# Patient Record
Sex: Female | Born: 1990 | Race: Black or African American | Hispanic: No | Marital: Single | Smoking: Former smoker
Health system: Southern US, Community
[De-identification: ages and names within clinical notes are randomized; demographics above are authoritative.]

## PROBLEM LIST (undated history)

## (undated) ENCOUNTER — Inpatient Hospital Stay (HOSPITAL_COMMUNITY): Payer: Self-pay

## (undated) DIAGNOSIS — F419 Anxiety disorder, unspecified: Secondary | ICD-10-CM

## (undated) DIAGNOSIS — G43909 Migraine, unspecified, not intractable, without status migrainosus: Secondary | ICD-10-CM

## (undated) DIAGNOSIS — A749 Chlamydial infection, unspecified: Secondary | ICD-10-CM

## (undated) DIAGNOSIS — O98819 Other maternal infectious and parasitic diseases complicating pregnancy, unspecified trimester: Secondary | ICD-10-CM

## (undated) HISTORY — PX: DILATION AND CURETTAGE OF UTERUS: SHX78

---
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
## 2010-05-05 LAB — POCT RAPID STREP A (OFFICE): Streptococcus, Group A Screen (Direct): POSITIVE — AB

## 2010-05-16 LAB — POCT URINALYSIS DIP (DEVICE)
Bilirubin Urine: NEGATIVE
Glucose, UA: NEGATIVE mg/dL
Hgb urine dipstick: NEGATIVE
Ketones, ur: NEGATIVE mg/dL
Nitrite: NEGATIVE
Protein, ur: NEGATIVE mg/dL
Specific Gravity, Urine: 1.015 (ref 1.005–1.030)
Urobilinogen, UA: 0.2 mg/dL (ref 0.0–1.0)
pH: 5 (ref 5.0–8.0)

## 2010-05-16 LAB — POCT PREGNANCY, URINE: Preg Test, Ur: NEGATIVE

## 2010-05-25 LAB — CBC
HCT: 27 % — ABNORMAL LOW (ref 36.0–49.0)
HCT: 33.8 % — ABNORMAL LOW (ref 36.0–49.0)
Hemoglobin: 11 g/dL — ABNORMAL LOW (ref 12.0–16.0)
Hemoglobin: 8.8 g/dL — ABNORMAL LOW (ref 12.0–16.0)
MCHC: 32.6 g/dL (ref 31.0–37.0)
MCHC: 32.7 g/dL (ref 31.0–37.0)
MCV: 89.7 fL (ref 78.0–98.0)
MCV: 91.2 fL (ref 78.0–98.0)
Platelets: 159 10*3/uL (ref 150–400)
Platelets: 185 10*3/uL (ref 150–400)
RBC: 2.96 MIL/uL — ABNORMAL LOW (ref 3.80–5.70)
RBC: 3.76 MIL/uL — ABNORMAL LOW (ref 3.80–5.70)
RDW: 21.6 % — ABNORMAL HIGH (ref 11.4–15.5)
RDW: 21.8 % — ABNORMAL HIGH (ref 11.4–15.5)
WBC: 11.4 10*3/uL (ref 4.5–13.5)
WBC: 6.1 10*3/uL (ref 4.5–13.5)

## 2010-05-25 LAB — DIFFERENTIAL
Basophils Absolute: 0 10*3/uL (ref 0.0–0.1)
Basophils Relative: 0 % (ref 0–1)
Eosinophils Absolute: 0 10*3/uL (ref 0.0–1.2)
Eosinophils Relative: 0 % (ref 0–5)
Lymphocytes Relative: 13 % — ABNORMAL LOW (ref 24–48)
Lymphs Abs: 1.5 10*3/uL (ref 1.1–4.8)
Monocytes Absolute: 1 10*3/uL (ref 0.2–1.2)
Monocytes Relative: 9 % (ref 3–11)
Neutro Abs: 8.9 10*3/uL — ABNORMAL HIGH (ref 1.7–8.0)
Neutrophils Relative %: 78 % — ABNORMAL HIGH (ref 43–71)

## 2010-05-25 LAB — RPR: RPR Ser Ql: NONREACTIVE

## 2010-05-26 ENCOUNTER — Inpatient Hospital Stay (INDEPENDENT_AMBULATORY_CARE_PROVIDER_SITE_OTHER)
Admission: RE | Admit: 2010-05-26 | Discharge: 2010-05-26 | Disposition: A | Discharge status: Home / Self Care | Payer: Self-pay | Source: Ambulatory Visit | Attending: Family Medicine | Admitting: Family Medicine

## 2010-05-26 DIAGNOSIS — R609 Edema, unspecified: Secondary | ICD-10-CM

## 2010-06-02 LAB — URINALYSIS, ROUTINE W REFLEX MICROSCOPIC
Bilirubin Urine: NEGATIVE
Glucose, UA: NEGATIVE mg/dL
Ketones, ur: 15 mg/dL — AB
Nitrite: NEGATIVE
Protein, ur: NEGATIVE mg/dL
Specific Gravity, Urine: 1.015 (ref 1.005–1.030)
Urobilinogen, UA: 2 mg/dL — ABNORMAL HIGH (ref 0.0–1.0)
pH: 6 (ref 5.0–8.0)

## 2010-06-02 LAB — POCT PREGNANCY, URINE: Preg Test, Ur: POSITIVE

## 2010-06-02 LAB — CBC
HCT: 34.8 % — ABNORMAL LOW (ref 36.0–49.0)
Hemoglobin: 11.5 g/dL — ABNORMAL LOW (ref 12.0–16.0)
MCHC: 33.1 g/dL (ref 31.0–37.0)
MCV: 94.9 fL (ref 78.0–98.0)
Platelets: 199 10*3/uL (ref 150–400)
RBC: 3.66 MIL/uL — ABNORMAL LOW (ref 3.80–5.70)
RDW: 18.3 % — ABNORMAL HIGH (ref 11.4–15.5)
WBC: 6.7 10*3/uL (ref 4.5–13.5)

## 2010-06-02 LAB — URINE MICROSCOPIC-ADD ON

## 2010-06-02 LAB — HCG, QUANTITATIVE, PREGNANCY: hCG, Beta Chain, Quant, S: 34819 m[IU]/mL — ABNORMAL HIGH (ref <5)

## 2010-06-02 LAB — ABO/RH: ABO/RH(D): A POS

## 2010-08-14 ENCOUNTER — Emergency Department (HOSPITAL_COMMUNITY)
Admission: EM | Admit: 2010-08-14 | Discharge: 2010-08-14 | Disposition: A | Discharge status: Home / Self Care | Payer: Medicaid Other | Attending: Emergency Medicine | Admitting: Emergency Medicine

## 2010-08-14 ENCOUNTER — Emergency Department (HOSPITAL_COMMUNITY): Payer: Medicaid Other

## 2010-08-14 DIAGNOSIS — N390 Urinary tract infection, site not specified: Secondary | ICD-10-CM | POA: Insufficient documentation

## 2010-08-14 DIAGNOSIS — R1031 Right lower quadrant pain: Secondary | ICD-10-CM | POA: Insufficient documentation

## 2010-08-14 DIAGNOSIS — R109 Unspecified abdominal pain: Secondary | ICD-10-CM | POA: Insufficient documentation

## 2010-08-14 LAB — CBC
HCT: 35.6 % — ABNORMAL LOW (ref 36.0–46.0)
Hemoglobin: 12.4 g/dL (ref 12.0–15.0)
MCH: 31.5 pg (ref 26.0–34.0)
MCHC: 34.8 g/dL (ref 30.0–36.0)
MCV: 90.4 fL (ref 78.0–100.0)
Platelets: 248 10*3/uL (ref 150–400)
RBC: 3.94 MIL/uL (ref 3.87–5.11)
RDW: 14.1 % (ref 11.5–15.5)
WBC: 5.5 10*3/uL (ref 4.0–10.5)

## 2010-08-14 LAB — COMPREHENSIVE METABOLIC PANEL
ALT: 12 U/L (ref 0–35)
AST: 17 U/L (ref 0–37)
Albumin: 3.7 g/dL (ref 3.5–5.2)
Alkaline Phosphatase: 56 U/L (ref 39–117)
BUN: 11 mg/dL (ref 6–23)
CO2: 23 mEq/L (ref 19–32)
Calcium: 9.4 mg/dL (ref 8.4–10.5)
Chloride: 105 mEq/L (ref 96–112)
Creatinine, Ser: 0.75 mg/dL (ref 0.50–1.10)
GFR calc Af Amer: >60 mL/min (ref >60)
GFR calc non Af Amer: >60 mL/min (ref >60)
Glucose, Bld: 87 mg/dL (ref 70–99)
Potassium: 3.7 mEq/L (ref 3.5–5.1)
Sodium: 137 mEq/L (ref 135–145)
Total Bilirubin: 0.2 mg/dL — ABNORMAL LOW (ref 0.3–1.2)
Total Protein: 7.3 g/dL (ref 6.0–8.3)

## 2010-08-14 LAB — URINALYSIS, ROUTINE W REFLEX MICROSCOPIC
Bilirubin Urine: NEGATIVE
Glucose, UA: NEGATIVE mg/dL
Ketones, ur: NEGATIVE mg/dL
Leukocytes, UA: NEGATIVE
Nitrite: POSITIVE — AB
Protein, ur: NEGATIVE mg/dL
Specific Gravity, Urine: 1.022 (ref 1.005–1.030)
Urobilinogen, UA: 1 mg/dL (ref 0.0–1.0)
pH: 7 (ref 5.0–8.0)

## 2010-08-14 LAB — DIFFERENTIAL
Basophils Absolute: 0 10*3/uL (ref 0.0–0.1)
Basophils Relative: 0 % (ref 0–1)
Eosinophils Absolute: 0.1 10*3/uL (ref 0.0–0.7)
Eosinophils Relative: 2 % (ref 0–5)
Lymphocytes Relative: 53 % — ABNORMAL HIGH (ref 12–46)
Lymphs Abs: 2.9 10*3/uL (ref 0.7–4.0)
Monocytes Absolute: 0.6 10*3/uL (ref 0.1–1.0)
Monocytes Relative: 10 % (ref 3–12)
Neutro Abs: 1.9 10*3/uL (ref 1.7–7.7)
Neutrophils Relative %: 35 % — ABNORMAL LOW (ref 43–77)

## 2010-08-14 LAB — PREGNANCY, URINE: Preg Test, Ur: NEGATIVE

## 2010-08-14 LAB — URINE MICROSCOPIC-ADD ON

## 2010-08-14 LAB — POCT PREGNANCY, URINE: Preg Test, Ur: NEGATIVE

## 2010-11-14 LAB — GC/CHLAMYDIA PROBE AMP, GENITAL
Chlamydia, DNA Probe: NEGATIVE
GC Probe Amp, Genital: NEGATIVE

## 2010-11-14 LAB — POCT PREGNANCY, URINE
Operator id: 12584
Preg Test, Ur: POSITIVE

## 2010-11-14 LAB — URINALYSIS, ROUTINE W REFLEX MICROSCOPIC
Bilirubin Urine: NEGATIVE
Glucose, UA: NEGATIVE
Hgb urine dipstick: NEGATIVE
Ketones, ur: NEGATIVE
Nitrite: NEGATIVE
Protein, ur: NEGATIVE
Specific Gravity, Urine: 1.015
Urobilinogen, UA: 0.2
pH: 7

## 2010-11-14 LAB — WET PREP, GENITAL
Clue Cells Wet Prep HPF POC: NONE SEEN
Yeast Wet Prep HPF POC: NONE SEEN

## 2011-09-23 IMAGING — CR DG ELBOW COMPLETE 3+V*L*
4 series · 4 of 4 positions shown · non-contrast
Comparison: None

CLINICAL DATA: Spider bite.  Pain, swelling, redness from below
elbow to just above the elbow on posterior side of left arm for 2
days.  Rule out involvement of the joint versus soft tissue.

LEFT ELBOW - COMPLETE 3+ VIEW

[w elbow joint a.p. left]
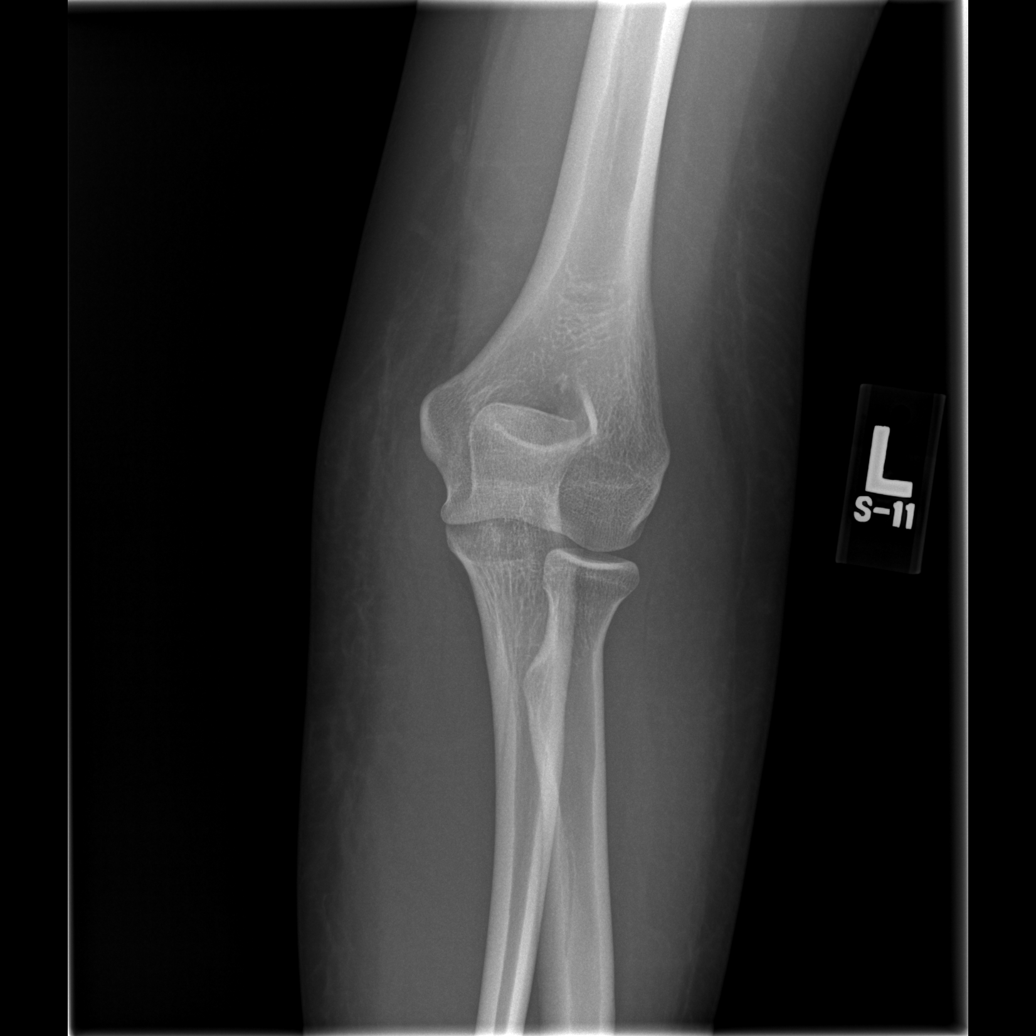

[w elbow joint obl left (1 of 2)]
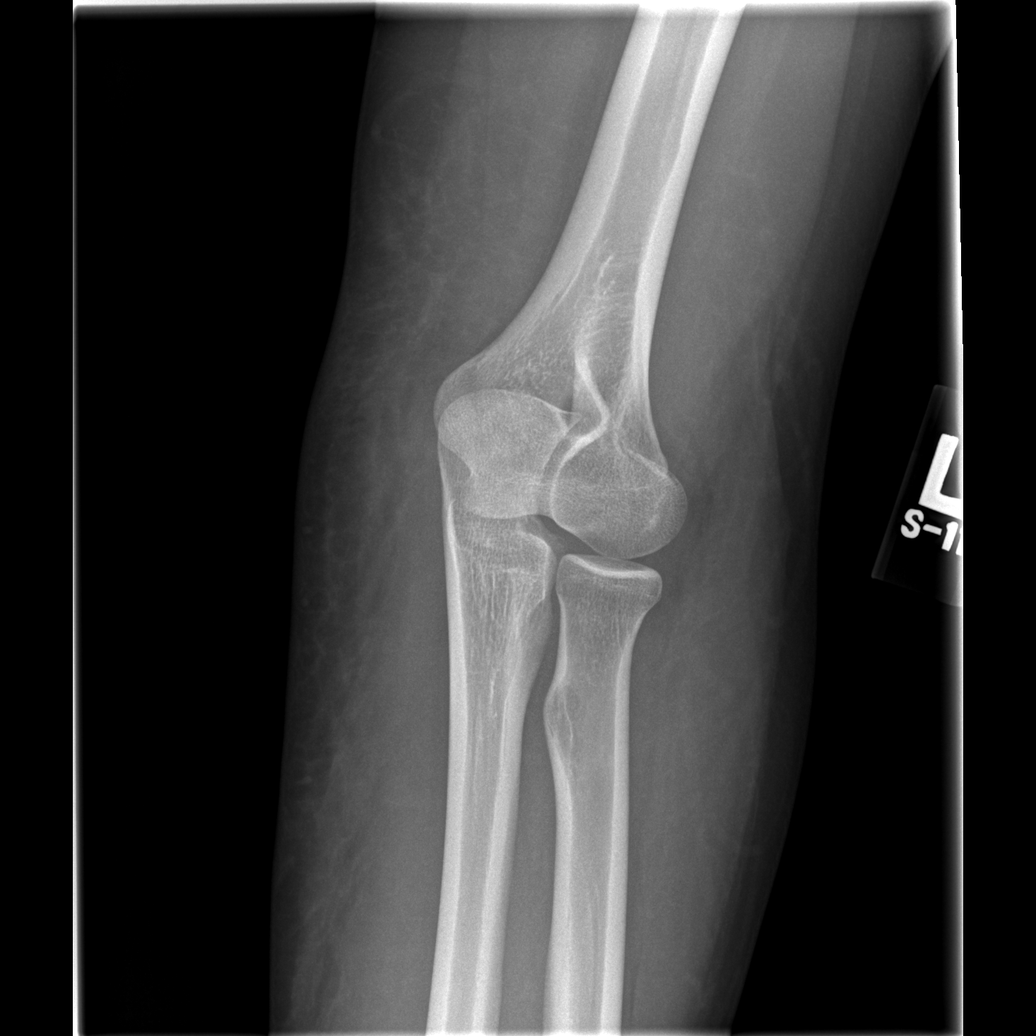

[w elbow joint obl left (2 of 2)]
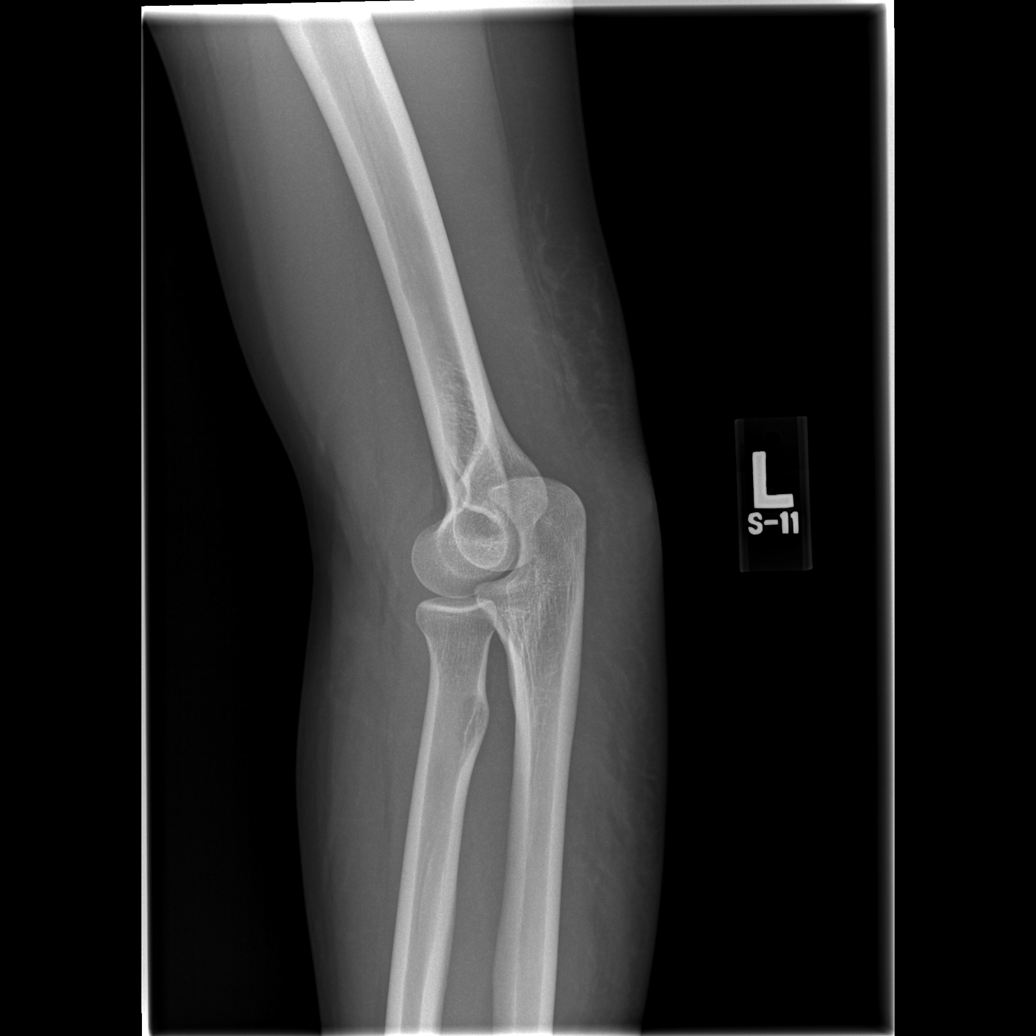

[w elbow joint lat left]
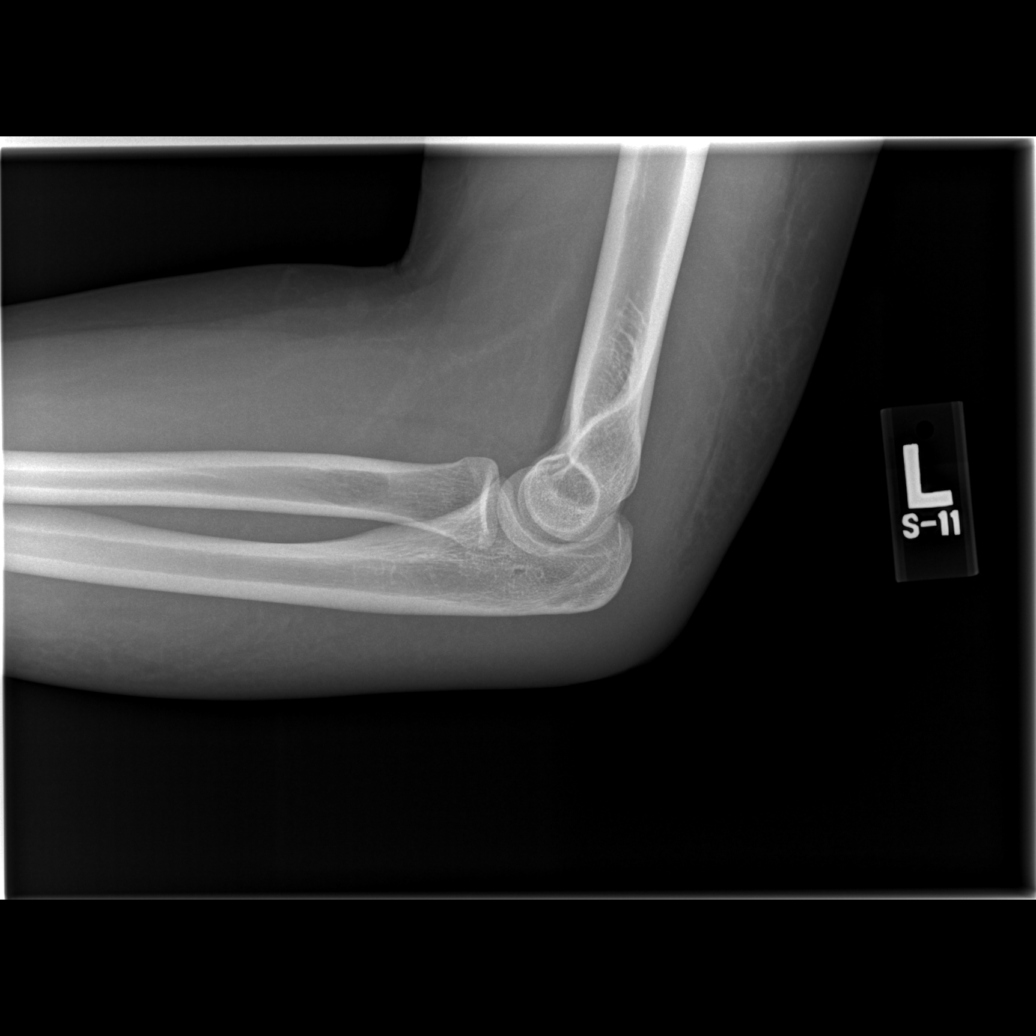

[4 of 4 positions shown; findings below may reference images not displayed]

FINDINGS: There is soft tissue swelling along the posterior aspect
of the elbow.  No evidence for acute fracture dislocation.  No
evidence for bony erosion or joint effusion.  No radiopaque foreign
body identified.
IMPRESSION: Soft tissue swelling. No evidence for acute bony abnormality.

## 2011-10-26 ENCOUNTER — Encounter (HOSPITAL_COMMUNITY): Payer: Self-pay | Admitting: *Deleted

## 2011-10-26 ENCOUNTER — Inpatient Hospital Stay (HOSPITAL_COMMUNITY)
Admission: AD | Admit: 2011-10-26 | Discharge: 2011-10-26 | Disposition: A | Discharge status: Home / Self Care | Payer: Medicaid Other | Source: Ambulatory Visit | Attending: Obstetrics & Gynecology | Admitting: Obstetrics & Gynecology

## 2011-10-26 ENCOUNTER — Inpatient Hospital Stay (HOSPITAL_COMMUNITY): Payer: Medicaid Other

## 2011-10-26 DIAGNOSIS — A499 Bacterial infection, unspecified: Secondary | ICD-10-CM

## 2011-10-26 DIAGNOSIS — R109 Unspecified abdominal pain: Secondary | ICD-10-CM | POA: Insufficient documentation

## 2011-10-26 DIAGNOSIS — O2390 Unspecified genitourinary tract infection in pregnancy, unspecified trimester: Secondary | ICD-10-CM | POA: Insufficient documentation

## 2011-10-26 DIAGNOSIS — N760 Acute vaginitis: Secondary | ICD-10-CM | POA: Insufficient documentation

## 2011-10-26 DIAGNOSIS — B9689 Other specified bacterial agents as the cause of diseases classified elsewhere: Secondary | ICD-10-CM | POA: Insufficient documentation

## 2011-10-26 DIAGNOSIS — Z3490 Encounter for supervision of normal pregnancy, unspecified, unspecified trimester: Secondary | ICD-10-CM

## 2011-10-26 HISTORY — DX: Migraine, unspecified, not intractable, without status migrainosus: G43.909

## 2011-10-26 LAB — URINE MICROSCOPIC-ADD ON

## 2011-10-26 LAB — POCT PREGNANCY, URINE: Preg Test, Ur: POSITIVE — AB

## 2011-10-26 LAB — WET PREP, GENITAL: Yeast Wet Prep HPF POC: NONE SEEN

## 2011-10-26 LAB — URINALYSIS, ROUTINE W REFLEX MICROSCOPIC
Bilirubin Urine: NEGATIVE
Glucose, UA: NEGATIVE mg/dL
Hgb urine dipstick: NEGATIVE
Ketones, ur: NEGATIVE mg/dL
Nitrite: NEGATIVE
Protein, ur: NEGATIVE mg/dL
Specific Gravity, Urine: 1.025 (ref 1.005–1.030)
Urobilinogen, UA: 0.2 mg/dL (ref 0.0–1.0)
pH: 7 (ref 5.0–8.0)

## 2011-10-26 LAB — CBC
HCT: 35.3 % — ABNORMAL LOW (ref 36.0–46.0)
Hemoglobin: 11.9 g/dL — ABNORMAL LOW (ref 12.0–15.0)
MCH: 31.2 pg (ref 26.0–34.0)
MCHC: 33.7 g/dL (ref 30.0–36.0)
MCV: 92.7 fL (ref 78.0–100.0)
Platelets: 229 10*3/uL (ref 150–400)
RBC: 3.81 MIL/uL — ABNORMAL LOW (ref 3.87–5.11)
RDW: 14.1 % (ref 11.5–15.5)
WBC: 6.2 10*3/uL (ref 4.0–10.5)

## 2011-10-26 LAB — ABO/RH: ABO/RH(D): A POS

## 2011-10-26 LAB — HCG, QUANTITATIVE, PREGNANCY: hCG, Beta Chain, Quant, S: 16008 m[IU]/mL — ABNORMAL HIGH (ref <5)

## 2011-10-26 MED ORDER — METRONIDAZOLE 500 MG PO TABS: 500.0000 mg | ORAL_TABLET | Freq: Two times a day (BID) | ORAL | Status: AC

## 2011-10-26 NOTE — MAU Provider Note (Signed)
History     CSN: [id number]  Arrival date and time: 10/26/11 1023   First Provider Initiated Contact with Patient 10/26/11 1059      Chief Complaint  Patient presents with  . Abdominal Pain   HPI 21 y.o. G2P1001 at [redacted]w[redacted]d with low abd pain x 1 week and spotting x 2 days. Crampy and sharp pain at night.    Past Medical History  Diagnosis Date  . Migraines     Past Surgical History  Procedure Date  . Vaginal hematoma 2010    after birth, to OR    Family History  Problem Relation Age of Onset  . Other Neg Hx     History  Substance Use Topics  . Smoking status: Current Some Day Smoker -- 1.0 packs/day  . Smokeless tobacco: Never Used  . Alcohol Use: No     Occassionally smokes a joint    Allergies:  Allergies  Allergen Reactions  . Shellfish Allergy     Throat swells and difficulty breathing    Prescriptions prior to admission  Medication Sig Dispense Refill  . aspirin-acetaminophen-caffeine (EXCEDRIN MIGRAINE) 250-250-65 MG per tablet Take 1 tablet by mouth daily as needed. For pain      . Pseudoeph-Doxylamine-DM-APAP (NYQUIL) 60-7.07-19-998 MG/30ML LIQD Take 30 mLs by mouth at bedtime as needed. For cold symptoms        Review of Systems  Constitutional: Negative.   Respiratory: Negative.   Cardiovascular: Negative.   Gastrointestinal: Positive for abdominal pain. Negative for nausea, vomiting, diarrhea and constipation.  Genitourinary: Negative for dysuria, urgency, frequency, hematuria and flank pain.       Positive for spotting   Musculoskeletal: Negative.   Neurological: Negative.   Psychiatric/Behavioral: Negative.    Physical Exam   Blood pressure 112/57, pulse 91, temperature 98.7 F (37.1 C), temperature source Oral, resp. rate 16, height 5' 6.5" (1.689 m), weight 180 lb 12.8 oz (82.01 kg), last menstrual period 09/14/2011, SpO2 100.00%.  Physical Exam  Nursing note and vitals reviewed. Constitutional: She is oriented to person, place, and  time. She appears well-developed and well-nourished. No distress.  HENT:  Head: Normocephalic and atraumatic.  Cardiovascular: Normal rate, regular rhythm and normal heart sounds.   Respiratory: Effort normal and breath sounds normal. No respiratory distress.  GI: Soft. Bowel sounds are normal. She exhibits no distension and no mass. There is no tenderness. There is no rebound and no guarding.  Genitourinary: There is no rash or lesion on the right labia. There is no rash or lesion on the left labia. Uterus is not deviated, not enlarged, not fixed and not tender. Cervix exhibits no motion tenderness, no discharge and no friability. Right adnexum displays no mass, no tenderness and no fullness. Left adnexum displays no mass, no tenderness and no fullness. No erythema, tenderness or bleeding around the vagina. No vaginal discharge found.  Neurological: She is alert and oriented to person, place, and time.  Skin: Skin is warm and dry.  Psychiatric: She has a normal mood and affect.    MAU Course  Procedures Results for orders placed during the hospital encounter of 10/26/11 (from the past 24 hour(s))  URINALYSIS, ROUTINE W REFLEX MICROSCOPIC     Status: Abnormal   Collection Time   10/26/11 10:30 AM      Component Value Range   Color, Urine YELLOW  YELLOW   APPearance CLEAR  CLEAR   Specific Gravity, Urine 1.025  1.005 - 1.030   pH 7.0  5.0 - 8.0   Glucose, UA NEGATIVE  NEGATIVE mg/dL   Hgb urine dipstick NEGATIVE  NEGATIVE   Bilirubin Urine NEGATIVE  NEGATIVE   Ketones, ur NEGATIVE  NEGATIVE mg/dL   Protein, ur NEGATIVE  NEGATIVE mg/dL   Urobilinogen, UA 0.2  0.0 - 1.0 mg/dL   Nitrite NEGATIVE  NEGATIVE   Leukocytes, UA TRACE (*) NEGATIVE  URINE MICROSCOPIC-ADD ON     Status: Abnormal   Collection Time   10/26/11 10:30 AM      Component Value Range   Squamous Epithelial / LPF RARE  RARE   WBC, UA 3-6  <3 WBC/hpf   RBC / HPF 0-2  <3 RBC/hpf   Bacteria, UA FEW (*) RARE   Urine-Other  MUCOUS PRESENT    POCT PREGNANCY, URINE     Status: Abnormal   Collection Time   10/26/11 10:38 AM      Component Value Range   Preg Test, Ur POSITIVE (*) NEGATIVE  CBC     Status: Abnormal   Collection Time   10/26/11 11:10 AM      Component Value Range   WBC 6.2  4.0 - 10.5 K/uL   RBC 3.81 (*) 3.87 - 5.11 MIL/uL   Hemoglobin 11.9 (*) 12.0 - 15.0 g/dL   HCT 16.1 (*) 09.6 - 04.5 %   MCV 92.7  78.0 - 100.0 fL   MCH 31.2  26.0 - 34.0 pg   MCHC 33.7  30.0 - 36.0 g/dL   RDW 40.9  81.1 - 91.4 %   Platelets 229  150 - 400 K/uL  ABO/RH     Status: Normal   Collection Time   10/26/11 11:10 AM      Component Value Range   ABO/RH(D) A POS    HCG, QUANTITATIVE, PREGNANCY     Status: Abnormal   Collection Time   10/26/11 11:10 AM      Component Value Range   hCG, Beta Chain, Quant, S 16008 (*) <5 mIU/mL  WET PREP, GENITAL     Status: Abnormal   Collection Time   10/26/11 12:30 PM      Component Value Range   Yeast Wet Prep HPF POC NONE SEEN  NONE SEEN   Trich, Wet Prep FEW (*) NONE SEEN   Clue Cells Wet Prep HPF POC MODERATE (*) NONE SEEN   WBC, Wet Prep HPF POC FEW (*) NONE SEEN   U/S: 6 weeks size IUGS with yolk sac, no embryo visualized  Assessment and Plan  21 y.o. G2P1001 at [redacted]w[redacted]d  BV - rx flagyl Start prenatal care ASAP  FRAZIER,NATALIE 10/26/2011, 10:59 AM

## 2011-10-26 NOTE — MAU Note (Signed)
Patient states she has been having abdominal pain for three days. No bleeding or discharge.

## 2011-10-27 LAB — GC/CHLAMYDIA PROBE AMP, GENITAL
Chlamydia, DNA Probe: POSITIVE — AB
GC Probe Amp, Genital: NEGATIVE

## 2012-01-01 ENCOUNTER — Inpatient Hospital Stay (HOSPITAL_COMMUNITY)
Admission: AD | Admit: 2012-01-01 | Discharge: 2012-01-01 | Disposition: A | Discharge status: Home / Self Care | Payer: Medicaid Other | Source: Ambulatory Visit | Attending: Obstetrics and Gynecology | Admitting: Obstetrics and Gynecology

## 2012-01-01 DIAGNOSIS — Z3490 Encounter for supervision of normal pregnancy, unspecified, unspecified trimester: Secondary | ICD-10-CM

## 2012-01-01 DIAGNOSIS — N949 Unspecified condition associated with female genital organs and menstrual cycle: Secondary | ICD-10-CM | POA: Insufficient documentation

## 2012-01-01 DIAGNOSIS — O99891 Other specified diseases and conditions complicating pregnancy: Secondary | ICD-10-CM | POA: Insufficient documentation

## 2012-01-01 LAB — WET PREP, GENITAL
Clue Cells Wet Prep HPF POC: NONE SEEN
Trich, Wet Prep: NONE SEEN
Yeast Wet Prep HPF POC: NONE SEEN

## 2012-01-01 LAB — URINALYSIS, ROUTINE W REFLEX MICROSCOPIC
Bilirubin Urine: NEGATIVE
Glucose, UA: NEGATIVE mg/dL
Hgb urine dipstick: NEGATIVE
Ketones, ur: NEGATIVE mg/dL
Leukocytes, UA: NEGATIVE
Nitrite: NEGATIVE
Protein, ur: NEGATIVE mg/dL
Specific Gravity, Urine: 1.01 (ref 1.005–1.030)
Urobilinogen, UA: 0.2 mg/dL (ref 0.0–1.0)
pH: 8.5 — ABNORMAL HIGH (ref 5.0–8.0)

## 2012-01-01 NOTE — MAU Provider Note (Signed)
History     CSN: [id number]  Arrival date and time: 01/01/12 1218   First Provider Initiated Contact with Patient 01/01/12 1315      Chief Complaint  Patient presents with  . Vaginal Discharge   HPI Amber Griffin is 21 y.o. G2P1001 [redacted]w[redacted]d weeks presenting with reports of a jelly like mucous discharge X 4 days ago and again today.  She was denied for medicaid and came to make sure her baby was ok.  Plans to reapply again today.  + FHTs by doppler.  Planned pregnancy.  Denies vaginal bleeding or pain.      Past Medical History  Diagnosis Date  . Migraines     Past Surgical History  Procedure Date  . Vaginal hematoma 2010    after birth, to OR    Family History  Problem Relation Age of Onset  . Other Neg Hx     History  Substance Use Topics  . Smoking status: Current Some Day Smoker -- 1.0 packs/day  . Smokeless tobacco: Never Used  . Alcohol Use: No     Comment: Occassionally smokes a joint    Allergies:  Allergies  Allergen Reactions  . Shellfish Allergy     Throat swells and difficulty breathing    Prescriptions prior to admission  Medication Sig Dispense Refill  . aspirin-acetaminophen-caffeine (EXCEDRIN MIGRAINE) 250-250-65 MG per tablet Take 1 tablet by mouth daily as needed. For pain      . Prenatal Vit-Fe Fumarate-FA (PRENATAL MULTIVITAMIN) TABS Take 1 tablet by mouth daily.        Review of Systems  Constitutional: Negative.   Respiratory: Negative.   Cardiovascular: Negative.   Gastrointestinal: Negative for abdominal pain.  Genitourinary:       + for discharge, neg for bleeding  Neurological: Negative for headaches.   Physical Exam   Height 5\' 8"  (1.727 m), weight 184 lb (83.462 kg), last menstrual period 09/14/2011.  Physical Exam  Constitutional: She is oriented to person, place, and time. She appears well-developed and well-nourished. No distress.  HENT:  Head: Normocephalic.  Neck: Normal range of motion.    Cardiovascular: Normal rate.   Respiratory: Effort normal.  GI: Soft. She exhibits no distension and no mass. There is no tenderness. There is no rebound and no guarding.  Genitourinary: There is no rash, tenderness or lesion on the right labia. There is no rash, tenderness or lesion on the left labia. Uterus is enlarged (measures 15-16 weeks in size.). Uterus is not tender. Cervix exhibits no motion tenderness, no discharge and no friability. No bleeding around the vagina. Vaginal discharge (white discharge without odor) found.  Neurological: She is alert and oriented to person, place, and time.  Skin: Skin is warm and dry.  Psychiatric: She has a normal mood and affect. Her behavior is normal.   Results for orders placed during the hospital encounter of 01/01/12 (from the past 24 hour(s))  URINALYSIS, ROUTINE W REFLEX MICROSCOPIC     Status: Abnormal   Collection Time   01/01/12 12:28 PM      Component Value Range   Color, Urine YELLOW  YELLOW   APPearance CLEAR  CLEAR   Specific Gravity, Urine 1.010  1.005 - 1.030   pH 8.5 (*) 5.0 - 8.0   Glucose, UA NEGATIVE  NEGATIVE mg/dL   Hgb urine dipstick NEGATIVE  NEGATIVE   Bilirubin Urine NEGATIVE  NEGATIVE   Ketones, ur NEGATIVE  NEGATIVE mg/dL   Protein, ur NEGATIVE  NEGATIVE mg/dL  Urobilinogen, UA 0.2  0.0 - 1.0 mg/dL   Nitrite NEGATIVE  NEGATIVE   Leukocytes, UA NEGATIVE  NEGATIVE  WET PREP, GENITAL     Status: Abnormal   Collection Time   01/01/12  1:22 PM      Component Value Range   Yeast Wet Prep HPF POC NONE SEEN  NONE SEEN   Trich, Wet Prep NONE SEEN  NONE SEEN   Clue Cells Wet Prep HPF POC NONE SEEN  NONE SEEN   WBC, Wet Prep HPF POC FEW (*) NONE SEEN   MAU Course  Procedures  MDM Reviewed results with the patient.   Assessment and Plan  A:  Normal vaginal exam      Viable IUP pregnancy at [redacted]w[redacted]d gestation  P:  She plans care with Dr. Tia Masker scheduled appointment       Prenatal vitamins  daily.  Amber Griffin,EVE M 01/01/2012, 1:16 PM

## 2012-01-01 NOTE — MAU Note (Signed)
Patient is in with c/o mucus vaginal discharge that started yesterday and wants to make sure that her baby is okay. She states that medicaid she applied was denied and she will reapply. She denies pain or bleeding.

## 2012-01-02 LAB — GC/CHLAMYDIA PROBE AMP, GENITAL
Chlamydia, DNA Probe: NEGATIVE
GC Probe Amp, Genital: NEGATIVE

## 2012-01-03 NOTE — MAU Provider Note (Signed)
Attestation of Attending Supervision of Advanced Practitioner (CNM/NP): Evaluation and management procedures were performed by the Advanced Practitioner under my supervision and collaboration.  I have reviewed the Advanced Practitioner's note and chart, and I agree with the management and plan.  Amber Griffin 01/03/2012 10:46 AM

## 2012-01-31 LAB — OB RESULTS CONSOLE RPR: RPR: NONREACTIVE

## 2012-01-31 LAB — OB RESULTS CONSOLE GC/CHLAMYDIA: Chlamydia: NEGATIVE

## 2012-01-31 LAB — OB RESULTS CONSOLE HEPATITIS B SURFACE ANTIGEN: Hepatitis B Surface Ag: NEGATIVE

## 2012-01-31 LAB — OB RESULTS CONSOLE HIV ANTIBODY (ROUTINE TESTING): HIV: NONREACTIVE

## 2012-01-31 LAB — OB RESULTS CONSOLE ABO/RH: RH Type: POSITIVE

## 2012-01-31 LAB — OB RESULTS CONSOLE ANTIBODY SCREEN: Antibody Screen: NEGATIVE

## 2012-01-31 LAB — OB RESULTS CONSOLE RUBELLA ANTIBODY, IGM: Rubella: IMMUNE

## 2012-02-03 ENCOUNTER — Encounter: Payer: Self-pay | Admitting: Obstetrics

## 2012-02-21 NOTE — L&D Delivery Note (Signed)
Delivery Note At 3:09 AM a viable female was delivered via Vaginal, Spontaneous Delivery (Presentation: ; Occiput Anterior).  APGAR: 8, ; weight .   Placenta status: Intact, Spontaneous.  Cord: 3 vessels with the following complications: None.  Cord pH: none  Anesthesia: Epidural  Episiotomy: None Lacerations: 2nd degree Suture Repair: 2.0 chromic Est. Blood Loss (mL): 350  Mom to postpartum.  Baby to nursery-stable.  HARPER,CHARLES A 06/30/2012, 3:49 AM

## 2012-04-28 ENCOUNTER — Inpatient Hospital Stay (HOSPITAL_COMMUNITY)
Admission: AD | Admit: 2012-04-28 | Discharge: 2012-04-28 | Disposition: A | Discharge status: Home / Self Care | Payer: Medicaid Other | Source: Ambulatory Visit | Attending: Obstetrics | Admitting: Obstetrics

## 2012-04-28 ENCOUNTER — Encounter (HOSPITAL_COMMUNITY): Payer: Self-pay | Admitting: Obstetrics and Gynecology

## 2012-04-28 ENCOUNTER — Inpatient Hospital Stay (HOSPITAL_COMMUNITY): Payer: Medicaid Other

## 2012-04-28 DIAGNOSIS — IMO0002 Reserved for concepts with insufficient information to code with codable children: Secondary | ICD-10-CM | POA: Insufficient documentation

## 2012-04-28 DIAGNOSIS — O99891 Other specified diseases and conditions complicating pregnancy: Secondary | ICD-10-CM | POA: Insufficient documentation

## 2012-04-28 DIAGNOSIS — Y92009 Unspecified place in unspecified non-institutional (private) residence as the place of occurrence of the external cause: Secondary | ICD-10-CM | POA: Insufficient documentation

## 2012-04-28 DIAGNOSIS — R109 Unspecified abdominal pain: Secondary | ICD-10-CM | POA: Insufficient documentation

## 2012-04-28 DIAGNOSIS — M545 Low back pain, unspecified: Secondary | ICD-10-CM | POA: Insufficient documentation

## 2012-04-28 DIAGNOSIS — M7989 Other specified soft tissue disorders: Secondary | ICD-10-CM | POA: Insufficient documentation

## 2012-04-28 MED ORDER — CYCLOBENZAPRINE HCL 10 MG PO TABS
10.0000 mg | ORAL_TABLET | Freq: Once | ORAL | Status: AC
  Administered 2012-04-28: 10 mg via ORAL
  Filled 2012-04-28: qty 1

## 2012-04-28 MED ORDER — ACETAMINOPHEN-CODEINE #3 300-30 MG PO TABS
1.0000 | ORAL_TABLET | Freq: Once | ORAL | Status: AC
  Administered 2012-04-28: 1 via ORAL
  Filled 2012-04-28: qty 1

## 2012-04-28 MED ORDER — ACETAMINOPHEN-CODEINE #3 300-30 MG PO TABS: 1.0000 | ORAL_TABLET | Freq: Four times a day (QID) | ORAL | Status: DC | PRN
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
## 2012-05-13 ENCOUNTER — Other Ambulatory Visit: Payer: Self-pay | Admitting: Medical

## 2012-05-13 ENCOUNTER — Encounter: Payer: Self-pay | Admitting: Obstetrics

## 2012-05-25 ENCOUNTER — Inpatient Hospital Stay (HOSPITAL_COMMUNITY)
Admission: AD | Admit: 2012-05-25 | Discharge: 2012-05-25 | Disposition: A | Discharge status: Home / Self Care | Payer: Medicaid Other | Source: Ambulatory Visit | Attending: Obstetrics | Admitting: Obstetrics

## 2012-05-25 ENCOUNTER — Encounter (HOSPITAL_COMMUNITY): Payer: Self-pay

## 2012-05-25 DIAGNOSIS — O99891 Other specified diseases and conditions complicating pregnancy: Secondary | ICD-10-CM | POA: Insufficient documentation

## 2012-05-25 DIAGNOSIS — O479 False labor, unspecified: Secondary | ICD-10-CM

## 2012-05-25 DIAGNOSIS — R109 Unspecified abdominal pain: Secondary | ICD-10-CM | POA: Insufficient documentation

## 2012-05-25 DIAGNOSIS — N949 Unspecified condition associated with female genital organs and menstrual cycle: Secondary | ICD-10-CM | POA: Insufficient documentation

## 2012-05-25 DIAGNOSIS — R1084 Generalized abdominal pain: Secondary | ICD-10-CM

## 2012-05-25 LAB — URINALYSIS, ROUTINE W REFLEX MICROSCOPIC
Bilirubin Urine: NEGATIVE
Glucose, UA: NEGATIVE mg/dL
Hgb urine dipstick: NEGATIVE
Ketones, ur: NEGATIVE mg/dL
Leukocytes, UA: NEGATIVE
Nitrite: NEGATIVE
Protein, ur: NEGATIVE mg/dL
Specific Gravity, Urine: 1.025 (ref 1.005–1.030)
Urobilinogen, UA: 0.2 mg/dL (ref 0.0–1.0)
pH: 6 (ref 5.0–8.0)

## 2012-05-25 NOTE — MAU Note (Signed)
Amber Griffin is here today with pelvic pain/pressure. She is [redacted]w[redacted]d; says the pain has been going on for about 1 weeks and has progressively gotten worse.

## 2012-05-25 NOTE — MAU Provider Note (Signed)
History     CSN: [id number]  Arrival date and time: 05/25/12 1423   First Provider Initiated Contact with Patient 05/25/12 1503      Chief Complaint  Patient presents with  . Pelvic Pain   HPI Amber Griffin is 22 y.o. G2P1001 [redacted]w[redacted]d weeks presenting with the "baby moving down this past week and it hurts" .  She drank a lot of fluids and napping to help with discomfort but today it worsened.   Patient of Dr. Thomes Lolling.  Has appt for this week for routine prenatal visit.  Denies vaginal bleeding or leaking of fluid.  Points to her sides .  Reports cramping and heavy pressure in the lower abdomen and vagina when she walks.  Patient has not eaten today.    Past Medical History  Diagnosis Date  . Migraines     Past Surgical History  Procedure Laterality Date  . Vaginal hematoma  2010    after birth, to OR    Family History  Problem Relation Age of Onset  . Other Neg Hx     History  Substance Use Topics  . Smoking status: Current Some Day Smoker -- 1.00 packs/day  . Smokeless tobacco: Never Used  . Alcohol Use: No     Comment: Occassionally smokes a joint    Allergies:  Allergies  Allergen Reactions  . Shellfish Allergy     Throat swells and difficulty breathing    Prescriptions prior to admission  Medication Sig Dispense Refill  . acetaminophen-codeine (TYLENOL #3) 300-30 MG per tablet Take 1 tablet by mouth every 6 (six) hours as needed for pain.  20 tablet  0  . Prenatal Vit-Fe Fumarate-FA (PRENATAL MULTIVITAMIN) TABS Take 1 tablet by mouth daily.        Review of Systems  Constitutional: Negative for fever and chills.  Gastrointestinal: Positive for abdominal pain (sides of her abdomen and pressure in the lower abdomen and vagina).  Genitourinary:       Neg for leaking of fluid or vaginal bleeding  Neurological: Negative for headaches.   Physical Exam   Blood pressure 107/71, pulse 85, temperature 97.2 F (36.2 C), temperature source Oral, resp.  rate 18, last menstrual period 09/14/2011.  Physical Exam  Constitutional: She is oriented to person, place, and time. She appears well-developed and well-nourished. No distress.  HENT:  Head: Normocephalic.  Neck: Normal range of motion.  Genitourinary:  Cervical exam by Elnita Maxwell, RN--1cm external os, internal os is closed, thick and posterior.   Neurological: She is alert and oriented to person, place, and time.  Skin: Skin is warm and dry.  Psychiatric: She has a normal mood and affect. Her behavior is normal.   Results for orders placed during the hospital encounter of 05/25/12 (from the past 24 hour(s))  URINALYSIS, ROUTINE W REFLEX MICROSCOPIC     Status: None   Collection Time    05/25/12  2:43 PM      Result Value Range   Color, Urine YELLOW  YELLOW   APPearance CLEAR  CLEAR   Specific Gravity, Urine 1.025  1.005 - 1.030   pH 6.0  5.0 - 8.0   Glucose, UA NEGATIVE  NEGATIVE mg/dL   Hgb urine dipstick NEGATIVE  NEGATIVE   Bilirubin Urine NEGATIVE  NEGATIVE   Ketones, ur NEGATIVE  NEGATIVE mg/dL   Protein, ur NEGATIVE  NEGATIVE mg/dL   Urobilinogen, UA 0.2  0.0 - 1.0 mg/dL   Nitrite NEGATIVE  NEGATIVE   Leukocytes, UA  NEGATIVE  NEGATIVE    MAU Course  Procedures  FMS Baseline 150, moderate variability, uterine irritability  MDM Called Dr. Clearance Coots to report MSE, FMS findings and cervical exam.  Order to discharge to home and reassure her.  Instruct her to keep scheduled appointment  Assessment and Plan  A:  Round ligament pain at [redacted]w[redacted]d gestation       P:  Reassured      Instructed to keep scheduled appointment to see Dr.Harper.  Call if sxs worsen  Keyontay Stolz,EVE M 05/25/2012, 3:04 PM

## 2012-05-28 ENCOUNTER — Ambulatory Visit: Payer: Medicaid Other | Admitting: Obstetrics

## 2012-06-18 ENCOUNTER — Ambulatory Visit (INDEPENDENT_AMBULATORY_CARE_PROVIDER_SITE_OTHER): Payer: Medicaid Other | Admitting: Obstetrics

## 2012-06-18 ENCOUNTER — Encounter: Payer: Self-pay | Admitting: Obstetrics

## 2012-06-18 VITALS — BP 112/74 | Temp 97.1°F | Wt 196.0 lb

## 2012-06-18 DIAGNOSIS — Z3483 Encounter for supervision of other normal pregnancy, third trimester: Secondary | ICD-10-CM

## 2012-06-18 DIAGNOSIS — Z3480 Encounter for supervision of other normal pregnancy, unspecified trimester: Secondary | ICD-10-CM

## 2012-06-18 LAB — POCT URINALYSIS DIPSTICK
Bilirubin, UA: NEGATIVE
Blood, UA: NEGATIVE
Glucose, UA: NEGATIVE
Ketones, UA: NEGATIVE
Nitrite, UA: NEGATIVE
Protein, UA: NEGATIVE
Spec Grav, UA: <=1.005
Urobilinogen, UA: NEGATIVE
pH, UA: 6

## 2012-06-18 NOTE — Progress Notes (Signed)
Pulse-103 Pt reports mild vaginal spotting x 2 days ago (only twice). Pt c/o constant lower back pain and leg pains in the mornings when stretching.

## 2012-06-26 ENCOUNTER — Encounter: Payer: Medicaid Other | Admitting: Obstetrics

## 2012-06-27 ENCOUNTER — Encounter: Payer: Self-pay | Admitting: Obstetrics

## 2012-06-27 ENCOUNTER — Telehealth (HOSPITAL_COMMUNITY): Payer: Self-pay | Admitting: *Deleted

## 2012-06-27 ENCOUNTER — Encounter (HOSPITAL_COMMUNITY): Payer: Self-pay | Admitting: *Deleted

## 2012-06-27 ENCOUNTER — Ambulatory Visit (INDEPENDENT_AMBULATORY_CARE_PROVIDER_SITE_OTHER): Payer: Medicaid Other | Admitting: Obstetrics

## 2012-06-27 VITALS — BP 113/74 | Temp 97.8°F | Wt 195.0 lb

## 2012-06-27 DIAGNOSIS — Z3480 Encounter for supervision of other normal pregnancy, unspecified trimester: Secondary | ICD-10-CM

## 2012-06-27 DIAGNOSIS — Z3483 Encounter for supervision of other normal pregnancy, third trimester: Secondary | ICD-10-CM

## 2012-06-27 LAB — POCT URINALYSIS DIPSTICK
Bilirubin, UA: NEGATIVE
Blood, UA: NEGATIVE
Glucose, UA: NEGATIVE
Ketones, UA: NEGATIVE
Leukocytes, UA: NEGATIVE
Nitrite, UA: NEGATIVE
Spec Grav, UA: 1.02
Urobilinogen, UA: NEGATIVE
pH, UA: 5

## 2012-06-27 NOTE — Addendum Note (Signed)
Addended by: Glendell Docker on: 06/27/2012 06:13 PM   Modules accepted: Orders

## 2012-06-27 NOTE — Addendum Note (Signed)
Addended by: Julaine Hua on: 06/27/2012 03:22 PM   Modules accepted: Orders

## 2012-06-27 NOTE — Progress Notes (Signed)
P 89 Patient reports she is doing well- wants to deliver

## 2012-06-27 NOTE — Patient Instructions (Signed)
Labor

## 2012-06-27 NOTE — Telephone Encounter (Signed)
Preadmission screen  

## 2012-06-28 ENCOUNTER — Other Ambulatory Visit: Payer: Self-pay | Admitting: *Deleted

## 2012-06-28 LAB — GC/CHLAMYDIA PROBE AMP, GENITAL
Chlamydia, DNA Probe: NEGATIVE
GC Probe Amp, Genital: NEGATIVE

## 2012-06-28 MED ORDER — OXYTOCIN 40 UNITS IN LACTATED RINGERS INFUSION - SIMPLE MED: 62.5000 mL/h | INTRAVENOUS | Status: DC

## 2012-06-28 MED ORDER — OXYTOCIN BOLUS FROM INFUSION: 500.0000 mL | INTRAVENOUS | Status: DC

## 2012-06-28 MED ORDER — CITRIC ACID-SODIUM CITRATE 334-500 MG/5ML PO SOLN: 30.0000 mL | ORAL | Status: DC | PRN

## 2012-06-28 MED ORDER — IBUPROFEN 200 MG PO TABS: 600.0000 mg | ORAL_TABLET | Freq: Four times a day (QID) | ORAL | Status: DC | PRN

## 2012-06-28 MED ORDER — LIDOCAINE HCL (PF) 1 % IJ SOLN: 30.0000 mL | INTRAMUSCULAR | Status: DC | PRN

## 2012-06-28 MED ORDER — MISOPROSTOL 25 MCG QUARTER TABLET: 25.0000 ug | ORAL_TABLET | ORAL | Status: DC | PRN

## 2012-06-28 MED ORDER — HYDROXYZINE HCL 10 MG PO TABS: 50.0000 mg | ORAL_TABLET | Freq: Four times a day (QID) | ORAL | Status: DC | PRN

## 2012-06-28 MED ORDER — LACTATED RINGERS IV SOLN: INTRAVENOUS | Status: DC

## 2012-06-28 MED ORDER — ONDANSETRON HCL 4 MG/2ML IJ SOLN: 4.0000 mg | Freq: Four times a day (QID) | INTRAMUSCULAR | Status: DC | PRN

## 2012-06-28 MED ORDER — BUTORPHANOL TARTRATE 1 MG/ML IJ SOLN: 1.0000 mg | INTRAMUSCULAR | Status: DC | PRN

## 2012-06-28 MED ORDER — ACETAMINOPHEN 325 MG PO TABS: 650.0000 mg | ORAL_TABLET | ORAL | Status: DC | PRN

## 2012-06-28 MED ORDER — TERBUTALINE SULFATE 1 MG/ML IJ SOLN: 0.2500 mg | Freq: Once | INTRAMUSCULAR | Status: AC | PRN

## 2012-06-28 MED ORDER — OXYCODONE-ACETAMINOPHEN 5-325 MG PO TABS: 1.0000 | ORAL_TABLET | ORAL | Status: DC | PRN

## 2012-06-28 MED ORDER — LACTATED RINGERS IV SOLN: 500.0000 mL | INTRAVENOUS | Status: DC | PRN

## 2012-06-29 ENCOUNTER — Inpatient Hospital Stay (HOSPITAL_COMMUNITY): Payer: Medicaid Other | Admitting: Anesthesiology

## 2012-06-29 ENCOUNTER — Encounter (HOSPITAL_COMMUNITY): Payer: Self-pay | Admitting: *Deleted

## 2012-06-29 ENCOUNTER — Encounter (HOSPITAL_COMMUNITY): Payer: Self-pay | Admitting: Anesthesiology

## 2012-06-29 ENCOUNTER — Inpatient Hospital Stay (HOSPITAL_COMMUNITY)
Admission: AD | Admit: 2012-06-29 | Discharge: 2012-07-02 | DRG: 775 | Disposition: A | Discharge status: Home / Self Care | Payer: Medicaid Other | Source: Ambulatory Visit | Attending: Obstetrics | Admitting: Obstetrics

## 2012-06-29 LAB — CBC
HCT: 33.4 % — ABNORMAL LOW (ref 36.0–46.0)
Hemoglobin: 11.2 g/dL — ABNORMAL LOW (ref 12.0–15.0)
MCH: 30 pg (ref 26.0–34.0)
MCHC: 33.5 g/dL (ref 30.0–36.0)
MCV: 89.5 fL (ref 78.0–100.0)
Platelets: 187 10*3/uL (ref 150–400)
RBC: 3.73 MIL/uL — ABNORMAL LOW (ref 3.87–5.11)
RDW: 16.3 % — ABNORMAL HIGH (ref 11.5–15.5)
WBC: 5.5 10*3/uL (ref 4.0–10.5)

## 2012-06-29 LAB — STREP B DNA PROBE: GBSP: NEGATIVE

## 2012-06-29 MED ORDER — ACETAMINOPHEN 325 MG PO TABS: 650.0000 mg | ORAL_TABLET | ORAL | Status: DC | PRN

## 2012-06-29 MED ORDER — PHENYLEPHRINE 40 MCG/ML (10ML) SYRINGE FOR IV PUSH (FOR BLOOD PRESSURE SUPPORT)
80.0000 ug | PREFILLED_SYRINGE | INTRAVENOUS | Status: DC | PRN
  Filled 2012-06-29: qty 2

## 2012-06-29 MED ORDER — PHENYLEPHRINE 40 MCG/ML (10ML) SYRINGE FOR IV PUSH (FOR BLOOD PRESSURE SUPPORT)
80.0000 ug | PREFILLED_SYRINGE | INTRAVENOUS | Status: DC | PRN
  Filled 2012-06-29: qty 2
  Filled 2012-06-29: qty 5

## 2012-06-29 MED ORDER — DIPHENHYDRAMINE HCL 50 MG/ML IJ SOLN: 12.5000 mg | INTRAMUSCULAR | Status: DC | PRN

## 2012-06-29 MED ORDER — OXYTOCIN 40 UNITS IN LACTATED RINGERS INFUSION - SIMPLE MED
62.5000 mL/h | INTRAVENOUS | Status: DC
  Administered 2012-06-30 (×2): 62.5 mL/h via INTRAVENOUS
  Filled 2012-06-29: qty 1000

## 2012-06-29 MED ORDER — CITRIC ACID-SODIUM CITRATE 334-500 MG/5ML PO SOLN: 30.0000 mL | ORAL | Status: DC | PRN

## 2012-06-29 MED ORDER — LACTATED RINGERS IV SOLN
INTRAVENOUS | Status: DC
  Administered 2012-06-29 (×2): via INTRAVENOUS

## 2012-06-29 MED ORDER — FENTANYL 2.5 MCG/ML BUPIVACAINE 1/10 % EPIDURAL INFUSION (WH - ANES)
14.0000 mL/h | INTRAMUSCULAR | Status: DC | PRN
  Administered 2012-06-29: 14 mL/h via EPIDURAL
  Filled 2012-06-29: qty 125

## 2012-06-29 MED ORDER — FLEET ENEMA 7-19 GM/118ML RE ENEM: 1.0000 | ENEMA | RECTAL | Status: DC | PRN

## 2012-06-29 MED ORDER — IBUPROFEN 600 MG PO TABS: 600.0000 mg | ORAL_TABLET | Freq: Four times a day (QID) | ORAL | Status: DC | PRN

## 2012-06-29 MED ORDER — LACTATED RINGERS IV SOLN: 500.0000 mL | INTRAVENOUS | Status: DC | PRN

## 2012-06-29 MED ORDER — EPHEDRINE 5 MG/ML INJ
10.0000 mg | INTRAVENOUS | Status: DC | PRN
  Filled 2012-06-29: qty 2

## 2012-06-29 MED ORDER — OXYTOCIN BOLUS FROM INFUSION
500.0000 mL | INTRAVENOUS | Status: DC
  Administered 2012-06-30: 500 mL via INTRAVENOUS

## 2012-06-29 MED ORDER — LIDOCAINE HCL (PF) 1 % IJ SOLN
30.0000 mL | INTRAMUSCULAR | Status: DC | PRN
  Administered 2012-06-30: 30 mL via SUBCUTANEOUS
  Filled 2012-06-29 (×2): qty 30

## 2012-06-29 MED ORDER — LACTATED RINGERS IV SOLN: 500.0000 mL | Freq: Once | INTRAVENOUS | Status: DC

## 2012-06-29 MED ORDER — OXYCODONE-ACETAMINOPHEN 5-325 MG PO TABS: 1.0000 | ORAL_TABLET | ORAL | Status: DC | PRN

## 2012-06-29 MED ORDER — ONDANSETRON HCL 4 MG/2ML IJ SOLN: 4.0000 mg | Freq: Four times a day (QID) | INTRAMUSCULAR | Status: DC | PRN

## 2012-06-29 MED ORDER — LIDOCAINE HCL (PF) 1 % IJ SOLN
INTRAMUSCULAR | Status: DC | PRN
  Administered 2012-06-29 (×4): 4 mL

## 2012-06-29 MED ORDER — EPHEDRINE 5 MG/ML INJ
10.0000 mg | INTRAVENOUS | Status: DC | PRN
  Filled 2012-06-29: qty 2
  Filled 2012-06-29: qty 4

## 2012-06-29 NOTE — Anesthesia Preprocedure Evaluation (Signed)
Anesthesia Evaluation  Patient identified by MRN, date of birth, ID band Patient awake    Reviewed: Allergy & Precautions, H&P , NPO status , Patient's Chart, lab work & pertinent test results, reviewed documented beta blocker date and time   History of Anesthesia Complications Negative for: history of anesthetic complications  Airway Mallampati: I TM Distance: >3 FB Neck ROM: full    Dental  (+) Teeth Intact   Pulmonary Current Smoker,  breath sounds clear to auscultation        Cardiovascular negative cardio ROS  Rhythm:regular Rate:Normal     Neuro/Psych  Headaches (3-4 x/week migraines, taking fioricet), negative psych ROS   GI/Hepatic negative GI ROS, Neg liver ROS,   Endo/Other  negative endocrine ROS  Renal/GU negative Renal ROS  negative genitourinary   Musculoskeletal   Abdominal   Peds  Hematology negative hematology ROS (+)   Anesthesia Other Findings   Reproductive/Obstetrics (+) Pregnancy (had a lot of "pain and problems" after prior delivery, including headache - but not related to epidural)                           Anesthesia Physical Anesthesia Plan  ASA: II  Anesthesia Plan: Epidural   Post-op Pain Management:    Induction:   Airway Management Planned:   Additional Equipment:   Intra-op Plan:   Post-operative Plan:   Informed Consent: I have reviewed the patients History and Physical, chart, labs and discussed the procedure including the risks, benefits and alternatives for the proposed anesthesia with the patient or authorized representative who has indicated his/her understanding and acceptance.     Plan Discussed with:   Anesthesia Plan Comments:         Anesthesia Quick Evaluation

## 2012-06-29 NOTE — Anesthesia Procedure Notes (Signed)
Epidural Patient location during procedure: OB Start time: 06/29/2012 10:07 PM  Staffing Performed by: anesthesiologist   Preanesthetic Checklist Completed: patient identified, site marked, surgical consent, pre-op evaluation, timeout performed, IV checked, risks and benefits discussed and monitors and equipment checked  Epidural Patient position: sitting Prep: site prepped and draped and DuraPrep Patient monitoring: continuous pulse ox and blood pressure Approach: midline Injection technique: LOR air  Needle:  Needle type: Tuohy  Needle gauge: 17 G Needle length: 9 cm and 9 Needle insertion depth: 6 cm Catheter type: closed end flexible Catheter size: 19 Gauge Catheter at skin depth: 11 cm Test dose: negative  Assessment Events: blood not aspirated, injection not painful, no injection resistance, negative IV test and no paresthesia  Additional Notes Discussed risk of headache, infection, bleeding, nerve injury and failed or incomplete block.  Patient voices understanding and wishes to proceed.  Epidural placed on third attempt with moderate difficulty due to patient screaming and constant excessive movement during placement.  Patient reported that procedure was very painful due to preexisting back pain.  Procedure became much easier once patient's mother came to the room and supported/coached her through the process.  No paresthesia.  Patient became comfortable after bolus doses given.  Jasmine December, MD Reason for block:procedure for pain

## 2012-06-30 ENCOUNTER — Inpatient Hospital Stay (HOSPITAL_COMMUNITY): Admission: RE | Admit: 2012-06-30 | Payer: Medicaid Other | Source: Ambulatory Visit

## 2012-06-30 ENCOUNTER — Encounter (HOSPITAL_COMMUNITY): Payer: Self-pay | Admitting: *Deleted

## 2012-06-30 LAB — CBC
HCT: 32.1 % — ABNORMAL LOW (ref 36.0–46.0)
Hemoglobin: 10.5 g/dL — ABNORMAL LOW (ref 12.0–15.0)
MCH: 29.6 pg (ref 26.0–34.0)
MCHC: 32.7 g/dL (ref 30.0–36.0)
MCV: 90.4 fL (ref 78.0–100.0)
Platelets: 152 10*3/uL (ref 150–400)
RBC: 3.55 MIL/uL — ABNORMAL LOW (ref 3.87–5.11)
RDW: 16.5 % — ABNORMAL HIGH (ref 11.5–15.5)
WBC: 10.5 10*3/uL (ref 4.0–10.5)

## 2012-06-30 LAB — RPR: RPR Ser Ql: NONREACTIVE

## 2012-06-30 MED ORDER — ONDANSETRON HCL 4 MG/2ML IJ SOLN: 4.0000 mg | INTRAMUSCULAR | Status: DC | PRN

## 2012-06-30 MED ORDER — MEDROXYPROGESTERONE ACETATE 150 MG/ML IM SUSP: 150.0000 mg | INTRAMUSCULAR | Status: DC | PRN

## 2012-06-30 MED ORDER — DIPHENHYDRAMINE HCL 25 MG PO CAPS: 25.0000 mg | ORAL_CAPSULE | Freq: Four times a day (QID) | ORAL | Status: DC | PRN

## 2012-06-30 MED ORDER — BENZOCAINE-MENTHOL 20-0.5 % EX AERO
1.0000 "application " | INHALATION_SPRAY | CUTANEOUS | Status: DC | PRN
  Administered 2012-06-30: 1 via TOPICAL
  Filled 2012-06-30: qty 56

## 2012-06-30 MED ORDER — OXYCODONE-ACETAMINOPHEN 5-325 MG PO TABS
1.0000 | ORAL_TABLET | ORAL | Status: DC | PRN
  Administered 2012-06-30 – 2012-07-01 (×3): 2 via ORAL
  Administered 2012-07-01 – 2012-07-02 (×2): 1 via ORAL
  Administered 2012-07-02 (×2): 2 via ORAL
  Filled 2012-06-30 (×3): qty 2
  Filled 2012-06-30 (×2): qty 1
  Filled 2012-06-30 (×2): qty 2

## 2012-06-30 MED ORDER — PRENATAL MULTIVITAMIN CH
1.0000 | ORAL_TABLET | Freq: Every day | ORAL | Status: DC
  Administered 2012-06-30 – 2012-07-02 (×3): 1 via ORAL
  Filled 2012-06-30 (×4): qty 1

## 2012-06-30 MED ORDER — ZOLPIDEM TARTRATE 5 MG PO TABS: 5.0000 mg | ORAL_TABLET | Freq: Every evening | ORAL | Status: DC | PRN

## 2012-06-30 MED ORDER — DIBUCAINE 1 % RE OINT: 1.0000 "application " | TOPICAL_OINTMENT | RECTAL | Status: DC | PRN

## 2012-06-30 MED ORDER — ONDANSETRON HCL 4 MG PO TABS: 4.0000 mg | ORAL_TABLET | ORAL | Status: DC | PRN

## 2012-06-30 MED ORDER — SIMETHICONE 80 MG PO CHEW: 80.0000 mg | CHEWABLE_TABLET | ORAL | Status: DC | PRN

## 2012-06-30 MED ORDER — TETANUS-DIPHTH-ACELL PERTUSSIS 5-2.5-18.5 LF-MCG/0.5 IM SUSP
0.5000 mL | Freq: Once | INTRAMUSCULAR | Status: AC
  Administered 2012-07-01: 0.5 mL via INTRAMUSCULAR

## 2012-06-30 MED ORDER — SENNOSIDES-DOCUSATE SODIUM 8.6-50 MG PO TABS
2.0000 | ORAL_TABLET | Freq: Every day | ORAL | Status: DC
  Administered 2012-07-01: 2 via ORAL

## 2012-06-30 MED ORDER — PNEUMOCOCCAL VAC POLYVALENT 25 MCG/0.5ML IJ INJ
0.5000 mL | INJECTION | INTRAMUSCULAR | Status: AC
  Filled 2012-06-30: qty 0.5

## 2012-06-30 MED ORDER — BUPIVACAINE HCL (PF) 0.5 % IJ SOLN
INTRAMUSCULAR | Status: DC | PRN
  Administered 2012-06-30 (×2): 2.5 mL via EPIDURAL

## 2012-06-30 MED ORDER — IBUPROFEN 600 MG PO TABS
600.0000 mg | ORAL_TABLET | Freq: Four times a day (QID) | ORAL | Status: DC
  Administered 2012-06-30 – 2012-07-02 (×10): 600 mg via ORAL
  Filled 2012-06-30 (×10): qty 1

## 2012-06-30 MED ORDER — BUPIVACAINE HCL (PF) 0.25 % IJ SOLN
INTRAMUSCULAR | Status: DC | PRN
  Administered 2012-06-30: 5 mL via EPIDURAL
  Administered 2012-06-30 (×2): 2.5 mL via EPIDURAL
  Administered 2012-06-30: 5 mL via EPIDURAL

## 2012-06-30 MED ORDER — OXYTOCIN 40 UNITS IN LACTATED RINGERS INFUSION - SIMPLE MED: 62.5000 mL/h | INTRAVENOUS | Status: DC | PRN

## 2012-06-30 MED ORDER — OXYTOCIN 10 UNIT/ML IJ SOLN
20.0000 [IU] | Freq: Once | INTRAMUSCULAR | Status: AC
  Administered 2012-06-30: 20 [IU]

## 2012-06-30 MED ORDER — OXYTOCIN 10 UNIT/ML IJ SOLN
INTRAMUSCULAR | Status: AC
  Filled 2012-06-30: qty 2

## 2012-06-30 MED ORDER — LANOLIN HYDROUS EX OINT: TOPICAL_OINTMENT | CUTANEOUS | Status: DC | PRN

## 2012-06-30 MED ORDER — WITCH HAZEL-GLYCERIN EX PADS: 1.0000 "application " | MEDICATED_PAD | CUTANEOUS | Status: DC | PRN

## 2012-06-30 MED ORDER — FENTANYL CITRATE 0.05 MG/ML IJ SOLN
100.0000 ug | Freq: Once | INTRAMUSCULAR | Status: AC
  Administered 2012-06-30: 100 ug via EPIDURAL
  Filled 2012-06-30: qty 2

## 2012-06-30 NOTE — Anesthesia Postprocedure Evaluation (Signed)
Anesthesia Post Note  Patient: @Amber Griffin @[facility]   Procedure(s) Performed: CLE/C/S  Anesthesia type: Epidural  Patient location: Mother/Baby  Post pain: Pain level controlled  Post assessment: Post-op Vital signs reviewed  Last Vitals: BP 95/51  Pulse 64  Temp(Src) 36.7 C (Oral)  Resp 16  Ht 5\' 8"  (1.727 m)  Wt 194 lb (87.998 kg)  BMI 29.5 kg/m2  SpO2 82%  LMP 09/14/2011  Post vital signs: Reviewed  Level of consciousness: awake  Complications: No apparent anesthesia complications

## 2012-06-30 NOTE — Progress Notes (Signed)
Amber Griffin is a 22 y.o. [id number] at [redacted]w[redacted]d by LMP admitted for active labor  Subjective:   Objective: BP 105/60  Pulse 90  Temp(Src) 97.7 F (36.5 C)  Resp 18  Ht 5\' 8"  (1.727 m)  Wt 194 lb (87.998 kg)  BMI 29.5 kg/m2  SpO2 82%  LMP 09/14/2011   Total I/O In: -  Out: 500 [Urine:150; Blood:350]  FHT:  FHR: 150 bpm, variability: moderate,  accelerations:  Present,  decelerations:  Absent UC:   regular, every 3 minutes SVE:   Dilation: 10 Effacement (%): 100 Station: +1 Exam by:: Bertram Millard, RN, Ace Gins RN  Labs: Lab Results  Component Value Date   WBC 5.5 06/29/2012   HGB 11.2* 06/29/2012   HCT 33.4* 06/29/2012   MCV 89.5 06/29/2012   PLT 187 06/29/2012    Assessment / Plan: Spontaneous labor, progressing normally  Labor: Progressing normally Preeclampsia:  n/a Fetal Wellbeing:  Category I Pain Control:  Epidural I/D:  n/a Anticipated MOD:  NSVD  HARPER,CHARLES A 06/30/2012, 3:47 AM

## 2012-06-30 NOTE — Progress Notes (Signed)
Pt ambulates to br, voids, pericare done and taught, gown changed.  Pt to wc.

## 2012-06-30 NOTE — Progress Notes (Signed)
Post Partum Day 0 Subjective: no complaints  Objective: Blood pressure 85/45, pulse 79, temperature 97.9 F (36.6 C), temperature source Oral, resp. rate 20, height 5\' 8"  (1.727 m), weight 194 lb (87.998 kg), last menstrual period 09/14/2011, SpO2 82.00%, unknown if currently breastfeeding.  Physical Exam:  General: alert and no distress Lochia: appropriate Uterine Fundus: firm Incision: healing well DVT Evaluation: No evidence of DVT seen on physical exam.   Recent Labs  06/29/12 2055 06/30/12 0619  HGB 11.2* 10.5*  HCT 33.4* 32.1*    Assessment/Plan: Plan for discharge tomorrow   LOS: 1 day   HARPER,CHARLES A 06/30/2012, 7:11 PM

## 2012-06-30 NOTE — H&P (Signed)
Amber Griffin is a 22 y.o. female presenting for UC's. Maternal Medical History:  Reason for admission: Contractions.  21 y o G2 P1010.  Presented at 41 weeks with UC's.  Contractions: Onset was 6-12 hours ago.    Fetal activity: Perceived fetal activity is normal.   Last perceived fetal movement was within the past hour.    Prenatal complications: no prenatal complications   OB History   Grav Para Term Preterm Abortions TAB SAB Ect Mult Living   3 2 2  1 1    2      Past Medical History  Diagnosis Date  . Migraines    Past Surgical History  Procedure Laterality Date  . Vaginal hematoma  2010    after birth, to OR   Family History: family history includes Hypertension in her mother.  There is no history of Other. Social History:  reports that she has been smoking.  She has never used smokeless tobacco. She reports that she uses illicit drugs (Marijuana). She reports that she does not drink alcohol.   Prenatal Transfer Tool  Maternal Diabetes: No Genetic Screening: Normal Maternal Ultrasounds/Referrals: Normal Fetal Ultrasounds or other Referrals:  None Maternal Substance Abuse:  No Significant Maternal Medications:  None Significant Maternal Lab Results:  None Other Comments:  None  Review of Systems  All other systems reviewed and are negative.    Dilation: 10 Effacement (%): 100 Station: +1 Exam by:: Bertram Millard, RN, Ace Gins RN Blood pressure 110/78, pulse 84, temperature 97.7 F (36.5 C), resp. rate 18, height 5\' 8"  (1.727 m), weight 194 lb (87.998 kg), last menstrual period 09/14/2011, SpO2 82.00%, unknown if currently breastfeeding. Maternal Exam:  Uterine Assessment: Contraction strength is moderate.  Contraction frequency is regular.   Abdomen: Patient reports no abdominal tenderness. Fetal presentation: vertex  Introitus: Normal vulva. Normal vagina.    Physical Exam  Nursing note and vitals reviewed. Constitutional: She is oriented to  person, place, and time. She appears well-developed and well-nourished.  HENT:  Head: Normocephalic and atraumatic.  Eyes: Conjunctivae are normal. Pupils are equal, round, and reactive to light.  Neck: Normal range of motion. Neck supple.  Cardiovascular: Normal rate and regular rhythm.   Respiratory: Effort normal.  GI: Soft.  Genitourinary: Vagina normal and uterus normal.  Musculoskeletal: Normal range of motion.  Neurological: She is alert and oriented to person, place, and time.  Skin: Skin is warm and dry.  Psychiatric: She has a normal mood and affect. Her behavior is normal. Judgment and thought content normal.    Prenatal labs: ABO, Rh: A/Positive/-- (12/11 0000) Antibody: Negative (12/11 0000) Rubella: Immune (12/11 0000) RPR: NON REACTIVE (05/10 2055)  HBsAg: Negative (12/11 0000)  HIV: Non-reactive (12/11 0000)  GBS: NEGATIVE (05/08 1524)   Assessment/Plan: 41 weeks.  Active labor.  Expectant management.   Rosey Eide A 06/30/2012, 3:36 AM

## 2012-07-01 NOTE — Progress Notes (Signed)
CSW completed assessment.  Full documentation to follow. 

## 2012-07-01 NOTE — Progress Notes (Signed)
Ur chart review completed.  

## 2012-07-01 NOTE — Progress Notes (Signed)
Post Partum Day 1 Subjective: no complaints  Objective: Blood pressure 104/53, pulse 104, temperature 97.7 F (36.5 C), temperature source Oral, resp. rate 18, height 5\' 8"  (1.727 m), weight 194 lb (87.998 kg), last menstrual period 09/14/2011, SpO2 82.00%, unknown if currently breastfeeding.  Physical Exam:  General: alert and no distress Lochia: appropriate Uterine Fundus: firm Incision: healing well DVT Evaluation: No evidence of DVT seen on physical exam.   Recent Labs  06/29/12 2055 06/30/12 0619  HGB 11.2* 10.5*  HCT 33.4* 32.1*    Assessment/Plan: Plan for discharge tomorrow   LOS: 2 days   HARPER,CHARLES A 07/01/2012, 5:51 AM

## 2012-07-02 MED ORDER — OXYCODONE-ACETAMINOPHEN 5-325 MG PO TABS: 1.0000 | ORAL_TABLET | ORAL | Status: DC | PRN

## 2012-07-02 MED ORDER — IBUPROFEN 600 MG PO TABS: 600.0000 mg | ORAL_TABLET | Freq: Four times a day (QID) | ORAL | Status: DC | PRN

## 2012-07-02 NOTE — Discharge Summary (Signed)
Obstetric Discharge Summary Reason for Admission: onset of labor Prenatal Procedures: ultrasound Intrapartum Procedures: spontaneous vaginal delivery Postpartum Procedures: none Complications-Operative and Postpartum: none Hemoglobin  Date Value Range Status  06/30/2012 10.5* 12.0 - 15.0 g/dL Final     HCT  Date Value Range Status  06/30/2012 32.1* 36.0 - 46.0 % Final    Physical Exam:  General: alert and no distress Lochia: appropriate Uterine Fundus: firm Incision: healing well DVT Evaluation: No evidence of DVT seen on physical exam.  Discharge Diagnoses: Term Pregnancy-delivered  Discharge Information: Date: 07/02/2012 Activity: pelvic rest Diet: routine Medications: PNV, Ibuprofen, Colace and Percocet Condition: stable Instructions: refer to practice specific booklet Discharge to: home Follow-up Information   Follow up with HARPER,CHARLES A, MD. Schedule an appointment as soon as possible for a visit in 6 weeks.   Contact information:   [address] [place] [address] [phone]       Newborn Data: Live born female  Birth Weight: 8 lb (3629 g) APGAR: 8,   Home with mother.  HARPER,CHARLES A 07/02/2012, 8:40 AM
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
## 2012-07-02 NOTE — Progress Notes (Signed)
Post Partum Day 2 Subjective: no complaints  Objective: Blood pressure 101/65, pulse 58, temperature 97.6 F (36.4 C), temperature source Oral, resp. rate 18, height 5\' 8"  (1.727 m), weight 194 lb (87.998 kg), last menstrual period 09/14/2011, SpO2 82.00%, unknown if currently breastfeeding.  Physical Exam:  General: alert and no distress Lochia: appropriate Uterine Fundus: firm Incision: healing well DVT Evaluation: No evidence of DVT seen on physical exam.   Recent Labs  06/29/12 2055 06/30/12 0619  HGB 11.2* 10.5*  HCT 33.4* 32.1*    Assessment/Plan: Discharge home   LOS: 3 days   Shaquala Broeker A 07/02/2012, 8:29 AM

## 2012-07-08 ENCOUNTER — Encounter (HOSPITAL_COMMUNITY): Payer: Self-pay | Admitting: *Deleted

## 2012-08-05 ENCOUNTER — Ambulatory Visit: Payer: Self-pay | Admitting: Obstetrics

## 2012-11-12 ENCOUNTER — Ambulatory Visit: Payer: Self-pay | Admitting: Obstetrics

## 2012-11-19 ENCOUNTER — Encounter (HOSPITAL_COMMUNITY): Payer: Self-pay

## 2012-11-19 ENCOUNTER — Inpatient Hospital Stay (HOSPITAL_COMMUNITY)
Admission: AD | Admit: 2012-11-19 | Discharge: 2012-11-19 | Disposition: A | Discharge status: Home / Self Care | Payer: Medicaid Other | Source: Ambulatory Visit | Attending: Obstetrics & Gynecology | Admitting: Obstetrics & Gynecology

## 2012-11-19 ENCOUNTER — Inpatient Hospital Stay (HOSPITAL_COMMUNITY): Payer: Medicaid Other

## 2012-11-19 DIAGNOSIS — O039 Complete or unspecified spontaneous abortion without complication: Secondary | ICD-10-CM | POA: Insufficient documentation

## 2012-11-19 LAB — CBC
HCT: 26.2 % — ABNORMAL LOW (ref 36.0–46.0)
HCT: 24.2 % — ABNORMAL LOW (ref 36.0–46.0)
Hemoglobin: 9.3 g/dL — ABNORMAL LOW (ref 12.0–15.0)
Hemoglobin: 8.5 g/dL — ABNORMAL LOW (ref 12.0–15.0)
MCH: 33 pg (ref 26.0–34.0)
MCH: 32.7 pg (ref 26.0–34.0)
MCHC: 35.5 g/dL (ref 30.0–36.0)
MCHC: 35.1 g/dL (ref 30.0–36.0)
MCV: 92.9 fL (ref 78.0–100.0)
MCV: 93.1 fL (ref 78.0–100.0)
Platelets: 173 10*3/uL (ref 150–400)
Platelets: 160 10*3/uL (ref 150–400)
RBC: 2.82 MIL/uL — ABNORMAL LOW (ref 3.87–5.11)
RBC: 2.6 MIL/uL — ABNORMAL LOW (ref 3.87–5.11)
RDW: 13.8 % (ref 11.5–15.5)
RDW: 13.8 % (ref 11.5–15.5)
WBC: 12 10*3/uL — ABNORMAL HIGH (ref 4.0–10.5)
WBC: 14 10*3/uL — ABNORMAL HIGH (ref 4.0–10.5)

## 2012-11-19 LAB — TYPE AND SCREEN
ABO/RH(D): A POS
Antibody Screen: NEGATIVE
Unit division: 0
Unit division: 0

## 2012-11-19 LAB — PREPARE RBC (CROSSMATCH)

## 2012-11-19 MED ORDER — OXYCODONE-ACETAMINOPHEN 5-325 MG PO TABS
2.0000 | ORAL_TABLET | Freq: Once | ORAL | Status: AC
  Administered 2012-11-19: 2 via ORAL
  Filled 2012-11-19: qty 2

## 2012-11-19 MED ORDER — IRON (FERROUS GLUCONATE) 256 (28 FE) MG PO TABS: 1.0000 | ORAL_TABLET | Freq: Three times a day (TID) | ORAL | Status: DC

## 2012-11-19 MED ORDER — DOCUSATE SODIUM 100 MG PO CAPS: 100.0000 mg | ORAL_CAPSULE | Freq: Two times a day (BID) | ORAL | Status: DC

## 2012-11-19 MED ORDER — MISOPROSTOL 200 MCG PO TABS
800.0000 ug | ORAL_TABLET | Freq: Once | ORAL | Status: AC
  Administered 2012-11-19: 800 ug via RECTAL
  Filled 2012-11-19: qty 4

## 2012-11-19 NOTE — MAU Note (Signed)
Pt up to bathroom with no complaints of feeling faint or dizzy.

## 2012-11-19 NOTE — MAU Note (Signed)
Pt states only way home is the bus. Told pt that it would be unsafe for me to discharge her without a family member or friend to drive her home. Pt making phone calls to find a ride home.

## 2012-11-19 NOTE — MAU Note (Addendum)
Pt states cramping began around 2 pm. Around 1230am, she started to have sudden heavy vaginal bleeding and was passing clots.

## 2012-11-19 NOTE — MAU Provider Note (Signed)
History     CSN: [id number]  Arrival date and time: 11/19/12 7829   First Provider Initiated Contact with Patient 11/19/12 0158      No chief complaint on file.  HPI  Amber Griffin is a 22 y.o. [id number] at 11 weeks who presents today via EMS with heavy  Vaginal bleeding. She states that she has been cramping all day, and that about an hour ago she began "gushing" blood. She states that she has passed some clots. Upon arrival she states, "I think my baby just came out".  Past Medical History  Diagnosis Date  . Migraines     Past Surgical History  Procedure Laterality Date  . Vaginal hematoma  2010    after birth, to OR    Family History  Problem Relation Age of Onset  . Other Neg Hx   . Hypertension Mother     History  Substance Use Topics  . Smoking status: Current Some Day Smoker -- 1.00 packs/day  . Smokeless tobacco: Never Used  . Alcohol Use: No     Comment: Occassionally smokes a joint    Allergies:  Allergies  Allergen Reactions  . Shellfish Allergy     Throat swells and difficulty breathing    Facility-administered medications prior to admission  Medication Dose Route Frequency Provider Last Rate Last Dose  . oxyCODONE-acetaminophen (PERCOCET/ROXICET) 5-325 MG per tablet 1-2 tablet  1-2 tablet Oral Q3H PRN Brock Bad, MD       Prescriptions prior to admission  Medication Sig Dispense Refill  . ibuprofen (ADVIL,MOTRIN) 600 MG tablet Take 1 tablet (600 mg total) by mouth every 6 (six) hours as needed for pain.  30 tablet  5  . oxyCODONE-acetaminophen (PERCOCET/ROXICET) 5-325 MG per tablet Take 1-2 tablets by mouth every 4 (four) hours as needed for pain.  40 tablet  0  . Prenatal Vit-Fe Fumarate-FA (PRENATAL MULTIVITAMIN) TABS Take 1 tablet by mouth daily.        ROS Physical Exam   unknown if currently breastfeeding.  Physical Exam  Nursing note and vitals reviewed. Genitourinary:   Pants and towel soaked with blood  Large  clot coming from the vagina No evidence of any tissue at this time.    0210: Dr. Erin Fulling called to evaluate the patient.  0215: Dr. Derwood Kaplan here to see the patient, clot and POCs cleared out of the vagina and cervical os. Orders for of cytotec.  MAU Course  Procedures  Results for orders placed during the hospital encounter of 11/19/12 (from the past 24 hour(s))  CBC     Status: Abnormal   Collection Time    11/19/12  2:00 AM      Result Value Range   WBC 12.0 (*) 4.0 - 10.5 K/uL   RBC 2.82 (*) 3.87 - 5.11 MIL/uL   Hemoglobin 9.3 (*) 12.0 - 15.0 g/dL   HCT 65.7 (*) 84.6 - 96.2 %   MCV 92.9  78.0 - 100.0 fL   MCH 33.0  26.0 - 34.0 pg   MCHC 35.5  30.0 - 36.0 g/dL   RDW 95.2  84.1 - 32.4 %   Platelets 173  150 - 400 K/uL  TYPE AND SCREEN     Status: None   Collection Time    11/19/12  2:00 AM      Result Value Range   ABO/RH(D) A POS     Antibody Screen NEG     Sample Expiration 11/22/2012  Unit Number [id number]     Blood Component Type RED CELLS,LR     Unit division 00     Status of Unit ALLOCATED     Transfusion Status OK TO TRANSFUSE     Crossmatch Result Compatible     Unit Number [id number]     Blood Component Type RED CELLS,LR     Unit division 00     Status of Unit ALLOCATED     Transfusion Status OK TO TRANSFUSE     Crossmatch Result Compatible    PREPARE RBC (CROSSMATCH)     Status: None   Collection Time    11/19/12  2:24 AM      Result Value Range   Order Confirmation ORDER PROCESSED BY BLOOD BANK    CBC     Status: Abnormal   Collection Time    11/19/12  3:55 AM      Result Value Range   WBC 14.0 (*) 4.0 - 10.5 K/uL   RBC 2.60 (*) 3.87 - 5.11 MIL/uL   Hemoglobin 8.5 (*) 12.0 - 15.0 g/dL   HCT 30.8 (*) 65.7 - 84.6 %   MCV 93.1  78.0 - 100.0 fL   MCH 32.7  26.0 - 34.0 pg   MCHC 35.1  30.0 - 36.0 g/dL   RDW 96.2  95.2 - 84.1 %   Platelets 160  150 - 400 K/uL    0424: Patient has ambulated to the bathroom without difficulty. She  is requesting something to eat at this, and feels comfortable going home.  38: Reviewed with Dr. Erin Fulling, ok for dc home on iron TID.      Assessment and Plan   1. SAB (spontaneous abortion)    RX: Iron gluconate TID Colace  Danger signs reviewed Return to MAU as needed   [name] 11/19/2012, 1:59 AM

## 2012-11-20 NOTE — MAU Provider Note (Signed)
Pt seen and examined by me.  On her exam she was bleeding profusely and had a large amount of clots in the vagina. The clots were removed with a Ringed forceps and  There was a large amount of products of conception in the cervical os.  This tissue was removed and the bleeding stopped immediately.  Pt was observed and her vital were stable. Serial hcts/hgb were done.  She was subsequently discharged to home on feso4 to f/u in GYN clinic.  Joby Hershkowitz L. Harraway-Smith, M.D., [facility]

## 2012-11-21 ENCOUNTER — Encounter: Payer: Self-pay | Admitting: Obstetrics

## 2012-11-21 ENCOUNTER — Ambulatory Visit (INDEPENDENT_AMBULATORY_CARE_PROVIDER_SITE_OTHER): Payer: Medicaid Other | Admitting: Obstetrics

## 2012-11-21 ENCOUNTER — Inpatient Hospital Stay (HOSPITAL_COMMUNITY)
Admission: AD | Admit: 2012-11-21 | Discharge: 2012-11-21 | Disposition: A | Discharge status: Home / Self Care | Payer: Medicaid Other | Source: Ambulatory Visit | Attending: Obstetrics and Gynecology | Admitting: Obstetrics and Gynecology

## 2012-11-21 ENCOUNTER — Encounter (HOSPITAL_COMMUNITY): Payer: Self-pay | Admitting: *Deleted

## 2012-11-21 ENCOUNTER — Inpatient Hospital Stay (HOSPITAL_COMMUNITY): Payer: Medicaid Other | Admitting: Certified Registered"

## 2012-11-21 ENCOUNTER — Encounter (HOSPITAL_COMMUNITY): Payer: Self-pay | Admitting: Certified Registered"

## 2012-11-21 ENCOUNTER — Encounter (HOSPITAL_COMMUNITY)
Admission: AD | Disposition: A | Discharge status: Home / Self Care | Payer: Self-pay | Source: Ambulatory Visit | Attending: Obstetrics and Gynecology

## 2012-11-21 ENCOUNTER — Inpatient Hospital Stay (HOSPITAL_COMMUNITY): Payer: Medicaid Other

## 2012-11-21 VITALS — BP 116/71 | HR 98 | Temp 97.4°F | Ht 68.0 in | Wt 167.0 lb

## 2012-11-21 DIAGNOSIS — O039 Complete or unspecified spontaneous abortion without complication: Secondary | ICD-10-CM | POA: Insufficient documentation

## 2012-11-21 DIAGNOSIS — O021 Missed abortion: Secondary | ICD-10-CM

## 2012-11-21 DIAGNOSIS — O031 Delayed or excessive hemorrhage following incomplete spontaneous abortion: Secondary | ICD-10-CM | POA: Insufficient documentation

## 2012-11-21 DIAGNOSIS — O034 Incomplete spontaneous abortion without complication: Secondary | ICD-10-CM

## 2012-11-21 HISTORY — PX: DILATION AND EVACUATION: SHX1459

## 2012-11-21 LAB — CBC
HCT: 19 % — ABNORMAL LOW (ref 36.0–46.0)
Hemoglobin: 6.6 g/dL — CL (ref 12.0–15.0)
MCH: 32 pg (ref 26.0–34.0)
MCHC: 34.7 g/dL (ref 30.0–36.0)
MCV: 92.2 fL (ref 78.0–100.0)
Platelets: 203 10*3/uL (ref 150–400)
RBC: 2.06 MIL/uL — ABNORMAL LOW (ref 3.87–5.11)
RDW: 13.7 % (ref 11.5–15.5)
WBC: 7.8 10*3/uL (ref 4.0–10.5)

## 2012-11-21 SURGERY — DILATION AND EVACUATION, UTERUS
Anesthesia: Monitor Anesthesia Care | Site: Uterus | Wound class: Clean Contaminated

## 2012-11-21 MED ORDER — HYDROMORPHONE HCL PF 1 MG/ML IJ SOLN: 0.5000 mg | Freq: Once | INTRAMUSCULAR | Status: DC

## 2012-11-21 MED ORDER — ONDANSETRON HCL 4 MG/2ML IJ SOLN
INTRAMUSCULAR | Status: AC
  Filled 2012-11-21: qty 2

## 2012-11-21 MED ORDER — PROPOFOL 10 MG/ML IV EMUL
INTRAVENOUS | Status: AC
  Filled 2012-11-21: qty 20

## 2012-11-21 MED ORDER — DEXAMETHASONE SODIUM PHOSPHATE 10 MG/ML IJ SOLN
INTRAMUSCULAR | Status: DC | PRN
  Administered 2012-11-21: 10 mg via INTRAVENOUS

## 2012-11-21 MED ORDER — ONDANSETRON HCL 4 MG/2ML IJ SOLN
INTRAMUSCULAR | Status: DC | PRN
  Administered 2012-11-21: 4 mg via INTRAVENOUS

## 2012-11-21 MED ORDER — LACTATED RINGERS IV SOLN
INTRAVENOUS | Status: DC
  Administered 2012-11-21 (×2): via INTRAVENOUS

## 2012-11-21 MED ORDER — DOXYCYCLINE HYCLATE 100 MG IV SOLR
200.0000 mg | Freq: Two times a day (BID) | INTRAVENOUS | Status: DC
  Administered 2012-11-21: 200 mg via INTRAVENOUS
  Filled 2012-11-21 (×3): qty 200

## 2012-11-21 MED ORDER — FAMOTIDINE IN NACL 20-0.9 MG/50ML-% IV SOLN
20.0000 mg | Freq: Once | INTRAVENOUS | Status: AC
  Administered 2012-11-21: 20 mg via INTRAVENOUS
  Filled 2012-11-21: qty 50

## 2012-11-21 MED ORDER — MIDAZOLAM HCL 2 MG/2ML IJ SOLN
INTRAMUSCULAR | Status: DC | PRN
  Administered 2012-11-21: 2 mg via INTRAVENOUS

## 2012-11-21 MED ORDER — HYDROMORPHONE HCL PF 1 MG/ML IJ SOLN
0.5000 mg | Freq: Once | INTRAMUSCULAR | Status: AC
  Administered 2012-11-21: 0.5 mg via INTRAVENOUS
  Filled 2012-11-21: qty 1

## 2012-11-21 MED ORDER — KETOROLAC TROMETHAMINE 30 MG/ML IJ SOLN
INTRAMUSCULAR | Status: AC
  Filled 2012-11-21: qty 1

## 2012-11-21 MED ORDER — MIDAZOLAM HCL 2 MG/2ML IJ SOLN
INTRAMUSCULAR | Status: AC
  Filled 2012-11-21: qty 2

## 2012-11-21 MED ORDER — FENTANYL CITRATE 0.05 MG/ML IJ SOLN
INTRAMUSCULAR | Status: AC
  Filled 2012-11-21: qty 2

## 2012-11-21 MED ORDER — DEXAMETHASONE SODIUM PHOSPHATE 10 MG/ML IJ SOLN
INTRAMUSCULAR | Status: AC
  Filled 2012-11-21: qty 1

## 2012-11-21 MED ORDER — FENTANYL CITRATE 0.05 MG/ML IJ SOLN
INTRAMUSCULAR | Status: AC
  Administered 2012-11-21: 50 ug via INTRAVENOUS
  Filled 2012-11-21: qty 2

## 2012-11-21 MED ORDER — KETOROLAC TROMETHAMINE 30 MG/ML IJ SOLN: 15.0000 mg | Freq: Once | INTRAMUSCULAR | Status: DC | PRN

## 2012-11-21 MED ORDER — FENTANYL CITRATE 0.05 MG/ML IJ SOLN
INTRAMUSCULAR | Status: DC | PRN
  Administered 2012-11-21: 100 ug via INTRAVENOUS

## 2012-11-21 MED ORDER — OXYCODONE-ACETAMINOPHEN 5-325 MG PO TABS: 1.0000 | ORAL_TABLET | ORAL | Status: DC | PRN

## 2012-11-21 MED ORDER — MEPERIDINE HCL 25 MG/ML IJ SOLN: 6.2500 mg | INTRAMUSCULAR | Status: DC | PRN

## 2012-11-21 MED ORDER — KETOROLAC TROMETHAMINE 30 MG/ML IJ SOLN
30.0000 mg | Freq: Once | INTRAMUSCULAR | Status: AC
  Administered 2012-11-21: 30 mg via INTRAMUSCULAR
  Filled 2012-11-21: qty 1

## 2012-11-21 MED ORDER — LIDOCAINE HCL 1 % IJ SOLN
INTRAMUSCULAR | Status: DC | PRN
  Administered 2012-11-21: 10 mL

## 2012-11-21 MED ORDER — LIDOCAINE HCL (CARDIAC) 20 MG/ML IV SOLN
INTRAVENOUS | Status: AC
  Filled 2012-11-21: qty 5

## 2012-11-21 MED ORDER — PROPOFOL 10 MG/ML IV EMUL
INTRAVENOUS | Status: DC | PRN
  Administered 2012-11-21 (×3): 20 mg via INTRAVENOUS
  Administered 2012-11-21: 30 mg via INTRAVENOUS
  Administered 2012-11-21 (×2): 20 mg via INTRAVENOUS

## 2012-11-21 MED ORDER — METOCLOPRAMIDE HCL 5 MG/ML IJ SOLN: 10.0000 mg | Freq: Once | INTRAMUSCULAR | Status: DC | PRN

## 2012-11-21 MED ORDER — LIDOCAINE HCL (CARDIAC) 20 MG/ML IV SOLN
INTRAVENOUS | Status: DC | PRN
  Administered 2012-11-21: 80 mg via INTRAVENOUS

## 2012-11-21 MED ORDER — FENTANYL CITRATE 0.05 MG/ML IJ SOLN
25.0000 ug | INTRAMUSCULAR | Status: DC | PRN
  Administered 2012-11-21 (×3): 50 ug via INTRAVENOUS

## 2012-11-21 SURGICAL SUPPLY — 19 items
CATH ROBINSON RED A/P 16FR (CATHETERS) ×2 IMPLANT
DECANTER SPIKE VIAL GLASS SM (MISCELLANEOUS) ×2 IMPLANT
GLOVE BIOGEL PI IND STRL 6.5 (GLOVE) ×1 IMPLANT
GLOVE BIOGEL PI INDICATOR 6.5 (GLOVE) ×1
GLOVE SURG SS PI 6.0 STRL IVOR (GLOVE) ×2 IMPLANT
GOWN STRL REIN XL XLG (GOWN DISPOSABLE) ×4 IMPLANT
KIT BERKELEY 1ST TRIMESTER 3/8 (MISCELLANEOUS) ×2 IMPLANT
NEEDLE SPNL 22GX3.5 QUINCKE BK (NEEDLE) ×2 IMPLANT
NS IRRIG 1000ML POUR BTL (IV SOLUTION) ×2 IMPLANT
PACK VAGINAL MINOR WOMEN LF (CUSTOM PROCEDURE TRAY) ×2 IMPLANT
PAD OB MATERNITY 4.3X12.25 (PERSONAL CARE ITEMS) ×2 IMPLANT
PAD PREP 24X48 CUFFED NSTRL (MISCELLANEOUS) ×2 IMPLANT
SET BERKELEY SUCTION TUBING (SUCTIONS) ×2 IMPLANT
SYR CONTROL 10ML LL (SYRINGE) ×2 IMPLANT
TOWEL OR 17X24 6PK STRL BLUE (TOWEL DISPOSABLE) ×4 IMPLANT
VACURETTE 10 RIGID CVD (CANNULA) IMPLANT
VACURETTE 7MM CVD STRL WRAP (CANNULA) IMPLANT
VACURETTE 8 RIGID CVD (CANNULA) IMPLANT
VACURETTE 9 RIGID CVD (CANNULA) IMPLANT

## 2012-11-21 NOTE — Progress Notes (Signed)
Patient said her uncle, Cephas Darby,  was picking her up and she needed to be outside or he would leave her. Patient's discharge instructions were given, IV removed. I rolled patient to MAU in wheelchair. We waited there for 15 minutes and she made several calls and got no answer.  Patient wanted to walk outside. I rolled patient outside to look for ride. Patient wanted to walk down into parking lot because she claimed she saw her ride. There was not one in the car. Patient tried to allude nurse in parking lot and wanted to ride the bus. I asked her to come back inside and we would talk to [facility]. Once inside, patient attempted to call ride again, no answer. Patient insisted she had to go home. Patient signed informed concent to refuse to stay overnight.

## 2012-11-21 NOTE — Transfer of Care (Signed)
Immediate Anesthesia Transfer of Care Note  Patient: Amber Griffin  Procedure(s) Performed: Procedure(s): DILATATION AND EVACUATION (N/A)  Patient Location: PACU  Anesthesia Type:MAC  Level of Consciousness: awake, alert  and oriented  Airway & Oxygen Therapy: Patient Spontanous Breathing and Patient connected to nasal cannula oxygen  Post-op Assessment: Report given to PACU RN, Post -op Vital signs reviewed and stable and Patient moving all extremities  Post vital signs: Reviewed and stable  Complications: No apparent anesthesia complications

## 2012-11-21 NOTE — MAU Note (Signed)
Pt complaining of severe pain in her abdomine and back

## 2012-11-21 NOTE — MAU Provider Note (Signed)
History     CSN: [id number]  Arrival date and time: 11/21/12 1416   None     Chief Complaint  Patient presents with  . Vaginal Bleeding   HPI This is a 22 y.o. female who presents with persistent vaginal bleeding with clots, as well as cramping. Was seen several days ago and declined transfusion and surgery. Cytotec was used and bleeding had diminished.   Note from last visit On 11/19/12  Pt seen and examined by me. On her exam she was bleeding profusely and had a large amount of clots in the vagina. The clots were removed with a Ringed forceps and There was a large amount of products of conception in the cervical os. This tissue was removed and the bleeding stopped immediately. Pt was observed and her vital were stable. Serial hcts/hgb were done. She was subsequently discharged to home on feso4 to f/u in GYN clinic.  Carolyn L. Harraway-Smith, M.D., FACOG   RN Note: Pt complaining of severe pain in her abdomine and back       OB History   Grav Para Term Preterm Abortions TAB SAB Ect Mult Living   4 2 2  1 1    2       Past Medical History  Diagnosis Date  . Migraines     Past Surgical History  Procedure Laterality Date  . Vaginal hematoma  2010    after birth, to OR    Family History  Problem Relation Age of Onset  . Other Neg Hx   . Hypertension Mother   . Anxiety disorder Mother     History  Substance Use Topics  . Smoking status: Former Smoker -- 1.00 packs/day  . Smokeless tobacco: Never Used  . Alcohol Use: Yes     Comment: Socially    Allergies:  Allergies  Allergen Reactions  . Other Anaphylaxis    seafood  . Shellfish Allergy     Throat swells and difficulty breathing    Facility-administered medications prior to admission  Medication Dose Route Frequency Provider Last Rate Last Dose  . oxyCODONE-acetaminophen (PERCOCET/ROXICET) 5-325 MG per tablet 1-2 tablet  1-2 tablet Oral Q3H PRN Brock Bad, MD       Prescriptions prior to  admission  Medication Sig Dispense Refill  . aspirin-acetaminophen-caffeine (EXCEDRIN MIGRAINE) 250-250-65 MG per tablet Take 1 tablet by mouth every 6 (six) hours as needed for pain.      Marland Kitchen ibuprofen (ADVIL,MOTRIN) 200 MG tablet Take 400 mg by mouth every 6 (six) hours as needed for pain.        Review of Systems  Constitutional: Negative for fever and malaise/fatigue.  Gastrointestinal: Positive for abdominal pain. Negative for nausea, vomiting, diarrhea and constipation.       Vaginal bleeding with clots   Genitourinary: Negative for dysuria.  Neurological: Positive for weakness. Negative for dizziness.   Physical Exam   Blood pressure 106/57, pulse 89, last menstrual period 09/23/2012, not currently breastfeeding.  Filed Vitals:   11/21/12 1518 11/21/12 1519 11/21/12 1521  BP: 105/63 104/72 106/57  Pulse: 69 75 89    Physical Exam  Constitutional: She is oriented to person, place, and time. She appears well-developed and well-nourished. No distress.  HENT:  Head: Normocephalic.  Cardiovascular: Normal rate.   Respiratory: Effort normal.  GI: Soft. She exhibits no distension and no mass. There is tenderness (lower abdomen). There is no rebound and no guarding.  Genitourinary: Uterus normal. Vaginal discharge (moderate blood in vault)  found.  Musculoskeletal: Normal range of motion.  Neurological: She is alert and oriented to person, place, and time.  Skin: Skin is warm and dry.  Psychiatric: She has a normal mood and affect.    MAU Course  Procedures  Last Hemoglobin was 8.5  MDM Results for orders placed during the hospital encounter of 11/21/12 (from the past 24 hour(s))  CBC     Status: Abnormal   Collection Time    11/21/12  3:25 PM      Result Value Range   WBC 7.8  4.0 - 10.5 K/uL   RBC 2.06 (*) 3.87 - 5.11 MIL/uL   Hemoglobin 6.6 (*) 12.0 - 15.0 g/dL   HCT 91.4 (*) 78.2 - 95.6 %   MCV 92.2  78.0 - 100.0 fL   MCH 32.0  26.0 - 34.0 pg   MCHC 34.7  30.0 -  36.0 g/dL   RDW 21.3  08.6 - 57.8 %   Platelets 203  150 - 400 K/uL   US Ob Comp Less 14 Wks  11/19/2012   *RADIOLOGY REPORT*  Clinical Data: Patient with heavy bleeding.  OBSTETRIC <14 WK ULTRASOUND  Technique:  Transabdominal ultrasound was performed for evaluation of the gestation as well as the maternal uterus and adnexal regions.  Comparison:  Ultrasound 10/26/2011.  Intrauterine gestational sac: Not visualized  Maternal uterus/Adnexae: The endometrium is dilated measuring up to 5 cm and contains mixed echogenicity material.  Additionally a large amount of mixed echogenicity material is demonstrated within the vaginal canal. The right ovary is unremarkable measures 3.1 x 2.2 x 2.1 cm.  The left ovary is not visualized.  IMPRESSION: Large amount of mixed echogenicity material within the endometrial canal and vagina most compatible with blood products given history of heavy bleeding.  Retained products of conception is not excluded.   Original Report Authenticated By: Annia Belt, M.D   US Transvaginal Non-ob  11/21/2012   CLINICAL DATA:  Recent miscarriage. Persistent hazy vaginal bleeding. Evaluate for retained products.  EXAM: TRANSVAGINAL ULTRASOUND OF PELVIS  TECHNIQUE: Transvaginal ultrasound examination of the pelvis was performed including evaluation of the uterus, ovaries, adnexal regions, and pelvic cul-de-sac.  COMPARISON:  11/19/2012  FINDINGS: Uterus  Measurements: 11.3 x 6.5 x 7.6 cm. No fibroids identified.  Endometrium  Thickness: 29 mm. Persistent diffuse thickening and heterogeneous appearance noted. Some internal blood flow seen within the anterior portion of substance with the in the endometrial cavity, suspicious for retained products of conception.  Right ovary  Measurements: 2.3 x 2.7 x 2.0 cm. Normal appearance/no adnexal mass.  Left ovary  Measurements: 2.6 x 1.5 x 3.4 cm. Normal appearance/no adnexal mass.  Other findings:  No free fluid  IMPRESSION: Persistent heterogeneous  thickening of endometrium with internal blood flow seen anteriorly, suspicious for retained products of conception.  Normal ovaries. No adnexal mass or free fluid identified.   Electronically Signed   By: Myles Rosenthal   On: 11/21/2012 17:21    Assessment and Plan  A:  Retained POC      Severe anemia  P:  Discussed findings with patient who is very upset and nervous about pain      Dr Jolayne Panther will come see her when she is out of an emergent surgery      Offered transfusion again, pt undecided  [facility] 11/21/2012, 4:34 PM

## 2012-11-21 NOTE — H&P (Signed)
History     CSN: [id number]  Arrival date and time: 11/21/12 1416   None     Chief Complaint  Patient presents with  . Vaginal Bleeding   HPI This is a 22 y.o. female who presents with persistent vaginal bleeding with clots, as well as cramping. Was seen several days ago and declined transfusion and surgery. Cytotec was used and bleeding had diminished.   Note from last visit On 11/19/12  Pt seen and examined by me. On her exam she was bleeding profusely and had a large amount of clots in the vagina. The clots were removed with a Ringed forceps and There was a large amount of products of conception in the cervical os. This tissue was removed and the bleeding stopped immediately. Pt was observed and her vital were stable. Serial hcts/hgb were done. She was subsequently discharged to home on feso4 to f/u in GYN clinic.  Carolyn L. Harraway-Smith, M.D., FACOG   RN Note: Pt complaining of severe pain in her abdomine and back       OB History   Grav Para Term Preterm Abortions TAB SAB Ect Mult Living   4 2 2  1 1    2       Past Medical History  Diagnosis Date  . Migraines     Past Surgical History  Procedure Laterality Date  . Vaginal hematoma  2010    after birth, to OR    Family History  Problem Relation Age of Onset  . Other Neg Hx   . Hypertension Mother   . Anxiety disorder Mother     History  Substance Use Topics  . Smoking status: Former Smoker -- 1.00 packs/day  . Smokeless tobacco: Never Used  . Alcohol Use: Yes     Comment: Socially    Allergies:  Allergies  Allergen Reactions  . Other Anaphylaxis    seafood  . Shellfish Allergy     Throat swells and difficulty breathing    Facility-administered medications prior to admission  Medication Dose Route Frequency Provider Last Rate Last Dose  . oxyCODONE-acetaminophen (PERCOCET/ROXICET) 5-325 MG per tablet 1-2 tablet  1-2 tablet Oral Q3H PRN Brock Bad, MD       Prescriptions prior to  admission  Medication Sig Dispense Refill  . aspirin-acetaminophen-caffeine (EXCEDRIN MIGRAINE) 250-250-65 MG per tablet Take 1 tablet by mouth every 6 (six) hours as needed for pain.      Marland Kitchen ibuprofen (ADVIL,MOTRIN) 200 MG tablet Take 400 mg by mouth every 6 (six) hours as needed for pain.        Review of Systems  Constitutional: Negative for fever and malaise/fatigue.  Gastrointestinal: Positive for abdominal pain. Negative for nausea, vomiting, diarrhea and constipation.       Vaginal bleeding with clots   Genitourinary: Negative for dysuria.  Neurological: Positive for weakness. Negative for dizziness.   Physical Exam   Blood pressure 106/57, pulse 89, last menstrual period 09/23/2012, not currently breastfeeding.  Filed Vitals:   11/21/12 1518 11/21/12 1519 11/21/12 1521  BP: 105/63 104/72 106/57  Pulse: 69 75 89    Physical Exam  Constitutional: She is oriented to person, place, and time. She appears well-developed and well-nourished. No distress.  HENT:  Head: Normocephalic.  Cardiovascular: Normal rate.   Respiratory: Effort normal.  GI: Soft. She exhibits no distension and no mass. There is tenderness (lower abdomen). There is no rebound and no guarding.  Genitourinary: Uterus normal. Vaginal discharge (moderate blood in vault)  found.  Musculoskeletal: Normal range of motion.  Neurological: She is alert and oriented to person, place, and time.  Skin: Skin is warm and dry.  Psychiatric: She has a normal mood and affect.    MAU Course  Procedures  Last Hemoglobin was 8.5  MDM Results for orders placed during the hospital encounter of 11/21/12 (from the past 24 hour(s))  CBC     Status: Abnormal   Collection Time    11/21/12  3:25 PM      Result Value Range   WBC 7.8  4.0 - 10.5 K/uL   RBC 2.06 (*) 3.87 - 5.11 MIL/uL   Hemoglobin 6.6 (*) 12.0 - 15.0 g/dL   HCT 45.4 (*) 09.8 - 11.9 %   MCV 92.2  78.0 - 100.0 fL   MCH 32.0  26.0 - 34.0 pg   MCHC 34.7  30.0 -  36.0 g/dL   RDW 14.7  82.9 - 56.2 %   Platelets 203  150 - 400 K/uL   US Ob Comp Less 14 Wks  11/19/2012   *RADIOLOGY REPORT*  Clinical Data: Patient with heavy bleeding.  OBSTETRIC <14 WK ULTRASOUND  Technique:  Transabdominal ultrasound was performed for evaluation of the gestation as well as the maternal uterus and adnexal regions.  Comparison:  Ultrasound 10/26/2011.  Intrauterine gestational sac: Not visualized  Maternal uterus/Adnexae: The endometrium is dilated measuring up to 5 cm and contains mixed echogenicity material.  Additionally a large amount of mixed echogenicity material is demonstrated within the vaginal canal. The right ovary is unremarkable measures 3.1 x 2.2 x 2.1 cm.  The left ovary is not visualized.  IMPRESSION: Large amount of mixed echogenicity material within the endometrial canal and vagina most compatible with blood products given history of heavy bleeding.  Retained products of conception is not excluded.   Original Report Authenticated By: Annia Belt, M.D   US Transvaginal Non-ob  11/21/2012   CLINICAL DATA:  Recent miscarriage. Persistent hazy vaginal bleeding. Evaluate for retained products.  EXAM: TRANSVAGINAL ULTRASOUND OF PELVIS  TECHNIQUE: Transvaginal ultrasound examination of the pelvis was performed including evaluation of the uterus, ovaries, adnexal regions, and pelvic cul-de-sac.  COMPARISON:  11/19/2012  FINDINGS: Uterus  Measurements: 11.3 x 6.5 x 7.6 cm. No fibroids identified.  Endometrium  Thickness: 29 mm. Persistent diffuse thickening and heterogeneous appearance noted. Some internal blood flow seen within the anterior portion of substance with the in the endometrial cavity, suspicious for retained products of conception.  Right ovary  Measurements: 2.3 x 2.7 x 2.0 cm. Normal appearance/no adnexal mass.  Left ovary  Measurements: 2.6 x 1.5 x 3.4 cm. Normal appearance/no adnexal mass.  Other findings:  No free fluid  IMPRESSION: Persistent heterogeneous  thickening of endometrium with internal blood flow seen anteriorly, suspicious for retained products of conception.  Normal ovaries. No adnexal mass or free fluid identified.   Electronically Signed   By: Myles Rosenthal   On: 11/21/2012 17:21    Assessment and Plan  22 yo s/p miscarriage with retained POC - patient counseled on need for D&E in view of anemia. Risks, benefits, and alternatives were explained including risks of bleeding, infection, uterine perforation, and damage to adjacent organs. Patient verbalized understanding and all questions were answered. Consent signed.

## 2012-11-21 NOTE — Progress Notes (Signed)
Patient agitated and ready to leave while in Phase 11 of Recovery..  Patient refused to have support person come in for discharge instructions.  Patient given discharge instructions by this RN with Tammy , RN also providing discharge instructions.  Patient left by wheel chair at 23:13.

## 2012-11-21 NOTE — Progress Notes (Signed)
CRITICAL VALUE ALERT  Critical value received:  Hgb 6.6 and Hct 19  Date of notification:  11/21/2012  Time of notification:  1555  Critical value read back:yes  Nurse who received alert:  Deloris Montez Morita RN  MD notified (1st page): Wynelle Bourgeois CNM (in department)  Time of first page:  1556  Responding MD:  Wynelle Bourgeois CNM  Time MD responded:  1556

## 2012-11-21 NOTE — Progress Notes (Signed)
Subjective:     Amber Griffin is a 22 y.o. female here for a routine exam.  Current complaints: follow up after miscarriage. Pt states she had a miscarriage on 11-21-12. Pt states she lost 7 Liters of blood. Pt states she has continued to bleed and passing large clots. Pt states the clots are the size of a silver dollar. Pt states she is having severe cramping.  Personal health questionnaire reviewed: yes.   Gynecologic History Patient's last menstrual period was 09/23/2012. Contraception: none   Obstetric History OB History  Gravida Para Term Preterm AB SAB TAB Ectopic Multiple Living  4 2 2  1  1   2     # Outcome Date GA Lbr Len/2nd Weight Sex Delivery Anes PTL Lv  4 TRM 06/30/12 [redacted]w[redacted]d 08:45 / 00:24 8 lb (3.629 kg) F SVD EPI  Y  3 TRM 2010 [redacted]w[redacted]d 04:00 8 lb 1 oz (3.657 kg) F SVD EPI  Y  2 TAB 2009          1 GRA              Comments: System Generated. Please review and update pregnancy details.       The following portions of the patient's history were reviewed and updated as appropriate: allergies, current medications, past family history, past medical history, past social history, past surgical history and problem list.  Review of Systems Pertinent items are noted in HPI.     Objective:    No exam performed today, patient was seen at [facility] for SAB by a Faculty Physician who instructed her to F/U in the clinics..    Assessment:    SAB.  Managed by the Faculty Staff at [facility].   Plan:    Patient recommended to F/U as instructed by the staff at [facility]..  She agrees.

## 2012-11-21 NOTE — Op Note (Signed)
Amber Griffin PROCEDURE DATE: 11/21/2012  PREOPERATIVE DIAGNOSIS: retained products of conception POSTOPERATIVE DIAGNOSIS: The same. PROCEDURE:     Dilation and Evacuation. SURGEON:  Dr. Catalina Antigua  INDICATIONS: 22 y.o. [id number] with retained products of conception needing surgical completion.  Risks of surgery were discussed with the patient including but not limited to: bleeding which may require transfusion; infection which may require antibiotics; injury to uterus or surrounding organs;need for additional procedures including laparotomy or laparoscopy; possibility of intrauterine scarring which may impair future fertility; and other postoperative/anesthesia complications. Written informed consent was obtained.    FINDINGS:  A 8 size anteverted uterus, moderate amounts of products of conception, specimen sent to pathology.  ANESTHESIA:    Monitored intravenous sedation, paracervical block. INTRAVENOUS FLUIDS:  1000 ml of LR ESTIMATED BLOOD LOSS:  Less than 20 ml. SPECIMENS:  Products of conception sent to pathology COMPLICATIONS:  None immediate.  PROCEDURE DETAILS:  The patient received intravenous antibiotics while in the preoperative area.  She was then taken to the operating room where general anesthesia was administered and was found to be adequate.  After an adequate timeout was performed, she was placed in the dorsal lithotomy position and examined; then prepped and draped in the sterile manner.   Her bladder was catheterized for an unmeasured amount of clear, yellow urine. A vaginal speculum was then placed in the patient's vagina and a single tooth tenaculum was applied to the anterior lip of the cervix.  A paracervical block using 0.5% Marcaine was administered. The cervix was gently dilated to accommodate a 7 mm suction curette that was gently advanced to the uterine fundus.  The suction device was then activated and curette slowly rotated to clear the uterus of  products of conception.  A sharp curettage was then performed to confirm complete emptying of the uterus. There was minimal bleeding noted and the tenaculum removed with good hemostasis noted.   All instruments were removed from the patient's vagina. The patient tolerated the procedure well and was taken to the recovery area awake, and in stable condition.  The patient will be discharged to home as per PACU criteria.  Routine postoperative instructions given.  She was prescribed Percocet, Ibuprofen and Colace.  She will follow up in the clinic in 2 weeks for postoperative evaluation.

## 2012-11-21 NOTE — Anesthesia Postprocedure Evaluation (Signed)
  Anesthesia Post-op Note  Anesthesia Post Note  Patient: Amber Griffin  Procedure(s) Performed: Procedure(s) (LRB): DILATATION AND EVACUATION (N/A)  Anesthesia type: MAC  Patient location: PACU  Post pain: Pain level controlled  Post assessment: Post-op Vital signs reviewed  Last Vitals:  Filed Vitals:   11/21/12 2215  BP: 91/51  Pulse: 70  Temp:   Resp: 23    Post vital signs: Reviewed  Level of consciousness: sedated  Complications: No apparent anesthesia complications

## 2012-11-21 NOTE — Anesthesia Preprocedure Evaluation (Signed)
Anesthesia Evaluation  Patient identified by MRN, date of birth, ID band Patient awake    Reviewed: Allergy & Precautions, H&P , NPO status , Patient's Chart, lab work & pertinent test results, reviewed documented beta blocker date and time   History of Anesthesia Complications Negative for: history of anesthetic complications  Airway Mallampati: II TM Distance: >3 FB Neck ROM: full    Dental  (+) Loose,    Pulmonary neg pulmonary ROS,  breath sounds clear to auscultation        Cardiovascular negative cardio ROS  Rhythm:regular Rate:Normal     Neuro/Psych  Headaches, Patient reports she is very upset/stressed from "a lot of family drama" negative psych ROS   GI/Hepatic negative GI ROS, Neg liver ROS,   Endo/Other  negative endocrine ROS  Renal/GU negative Renal ROS  negative genitourinary   Musculoskeletal   Abdominal   Peds  Hematology  (+) anemia , Heavy bleeding from this incomplete AB, hgb 6.6   Anesthesia Other Findings Has not had anything to eat or drink today  Reproductive/Obstetrics (+) Pregnancy (8 weeks, missed AB)                           Anesthesia Physical Anesthesia Plan  ASA: II and emergent  Anesthesia Plan: MAC   Post-op Pain Management:    Induction:   Airway Management Planned:   Additional Equipment:   Intra-op Plan:   Post-operative Plan:   Informed Consent: I have reviewed the patients History and Physical, chart, labs and discussed the procedure including the risks, benefits and alternatives for the proposed anesthesia with the patient or authorized representative who has indicated his/her understanding and acceptance.     Plan Discussed with: Surgeon and CRNA  Anesthesia Plan Comments:         Anesthesia Quick Evaluation

## 2012-11-21 NOTE — Preoperative (Signed)
Beta Blockers   Reason not to administer Beta Blockers:Not Applicable 

## 2012-11-22 ENCOUNTER — Encounter (HOSPITAL_COMMUNITY): Payer: Self-pay | Admitting: Obstetrics and Gynecology

## 2012-12-05 ENCOUNTER — Ambulatory Visit: Payer: Medicaid Other | Admitting: Obstetrics and Gynecology

## 2013-01-06 ENCOUNTER — Encounter (HOSPITAL_COMMUNITY): Payer: Self-pay | Admitting: Emergency Medicine

## 2013-01-06 ENCOUNTER — Emergency Department (HOSPITAL_COMMUNITY): Payer: Medicaid Other

## 2013-01-06 ENCOUNTER — Emergency Department (HOSPITAL_COMMUNITY)
Admission: EM | Admit: 2013-01-06 | Discharge: 2013-01-06 | Disposition: A | Discharge status: Home / Self Care | Payer: Medicaid Other | Attending: Emergency Medicine | Admitting: Emergency Medicine

## 2013-01-06 DIAGNOSIS — Z711 Person with feared health complaint in whom no diagnosis is made: Secondary | ICD-10-CM | POA: Insufficient documentation

## 2013-01-06 DIAGNOSIS — Z8679 Personal history of other diseases of the circulatory system: Secondary | ICD-10-CM | POA: Insufficient documentation

## 2013-01-06 DIAGNOSIS — Z87891 Personal history of nicotine dependence: Secondary | ICD-10-CM | POA: Insufficient documentation

## 2013-01-06 NOTE — ED Provider Notes (Signed)
Medical screening examination/treatment/procedure(s) were performed by non-physician practitioner and as supervising physician I was immediately available for consultation/collaboration.  Patient concerned that she swallowed her earing  Imaging obtained, no foreign body visualized  Sunnie Nielsen, MD 01/06/13 [phone]

## 2013-01-06 NOTE — ED Provider Notes (Addendum)
CSN: [id number]     Arrival date & time    History   First MD Initiated Contact with Patient 01/06/13 0040     Chief Complaint  Patient presents with  . Swallowed Foreign Body   (Consider location/radiation/quality/duration/timing/severity/associated sxs/prior Treatment) HPI Comments: Patient states she woke up and her earring  was missing.  She thinks she may have swallowed or inhaled it.  She, states she will over her that included a then, she developed a sore throat, and maybe some right upper chest discomfort with deep breath, but no coughing, no gagging no blood in her mouth  Patient is a 22 y.o. female presenting with foreign body swallowed. The history is provided by the patient.  Swallowed Foreign Body This is a new problem. The current episode started today. The problem has been unchanged. Associated symptoms include a sore throat. Pertinent negatives include no abdominal pain, chills, coughing, fever or nausea. Nothing aggravates the symptoms. She has tried nothing for the symptoms. The treatment provided no relief.    Past Medical History  Diagnosis Date  . Migraines    Past Surgical History  Procedure Laterality Date  . Vaginal hematoma  2010    after birth, to OR  . Dilation and evacuation N/A 11/21/2012    Procedure: DILATATION AND EVACUATION;  Surgeon: Catalina Antigua, MD;  Location: [facility];  Service: Gynecology;  Laterality: N/A;   Family History  Problem Relation Age of Onset  . Other Neg Hx   . Hypertension Mother   . Anxiety disorder Mother    History  Substance Use Topics  . Smoking status: Former Smoker -- 1.00 packs/day  . Smokeless tobacco: Never Used  . Alcohol Use: Yes     Comment: Socially   OB History   Grav Para Term Preterm Abortions TAB SAB Ect Mult Living   4 2 2  1 1    2      Review of Systems  Constitutional: Negative for fever and chills.  HENT: Positive for sore throat.   Respiratory: Negative for cough.   Gastrointestinal: Negative  for nausea and abdominal pain.  All other systems reviewed and are negative.    Allergies  Other and Shellfish allergy  Home Medications   Current Outpatient Rx  Name  Route  Sig  Dispense  Refill  . ibuprofen (ADVIL,MOTRIN) 200 MG tablet   Oral   Take 400 mg by mouth every 6 (six) hours as needed for pain.         Marland Kitchen oxyCODONE-acetaminophen (PERCOCET/ROXICET) 5-325 MG per tablet   Oral   Take 1 tablet by mouth every 4 (four) hours as needed for pain.   20 tablet   0    BP 116/73  Pulse 72  Temp(Src) 97.9 F (36.6 C) (Oral)  Resp 18  SpO2 100%  LMP 09/23/2012 Physical Exam  Nursing note and vitals reviewed. Constitutional: She appears well-developed and well-nourished.  HENT:  Head: Normocephalic.  No evidence of trauma  Eyes: Pupils are equal, round, and reactive to light.  Neck: Normal range of motion.  Cardiovascular: Normal rate and regular rhythm.   Pulmonary/Chest: Effort normal.  Abdominal: Soft.  Musculoskeletal: Normal range of motion.  Neurological: She is alert.  Skin: Skin is warm.    ED Course  Procedures (including critical care time) Labs Review Labs Reviewed - No data to display Imaging Review Dg Abd 1 View  01/06/2013   CLINICAL DATA:  Swallowed foreign body  EXAM: ABDOMEN - 1 VIEW  COMPARISON:  None.  FINDINGS: No metallic foreign body from the level of the stomach to the rectum. The distal rectum and anus is not imaged. Nonobstructive bowel gas pattern. No abnormal intra-abdominal mass effect or calcification. No acute osseous findings.  IMPRESSION: No metallic foreign body from the stomach to the rectum.   Electronically Signed   By: Tiburcio Pea M.D.   On: 01/06/2013 01:29    EKG Interpretation   None       MDM   1. Physically well but worried     Chest and KUB x-rays, reviewed.  There is no evidence of foreign body    Arman Filter, NP 01/06/13 0133  Arman Filter, NP 01/06/13 [phone]

## 2013-01-06 NOTE — ED Provider Notes (Signed)
Medical screening examination/treatment/procedure(s) were performed by non-physician practitioner and as supervising physician I was immediately available for consultation/collaboration.    Sunnie Nielsen, MD 01/06/13 [phone]

## 2013-01-06 NOTE — ED Notes (Signed)
Pt states she awoke tonight missing and ear ring and has a sore throat and thus thinks she has swallow the ear ring.  Pt denies recent cold, cough or runny nose

## 2013-01-20 ENCOUNTER — Ambulatory Visit: Payer: Medicaid Other | Admitting: Obstetrics

## 2013-04-24 ENCOUNTER — Telehealth: Payer: Self-pay | Admitting: General Practice

## 2013-04-24 NOTE — Telephone Encounter (Signed)
Called pt to schedule NP appt, LVM

## 2013-08-27 ENCOUNTER — Telehealth: Payer: Self-pay | Admitting: *Deleted

## 2013-08-27 NOTE — Telephone Encounter (Signed)
Patient called and left message for unknown reason. Attempted to contact patient and left message on voicemail for patient to contact the office.

## 2013-09-16 NOTE — Telephone Encounter (Signed)
No response from patient - refile call 

## 2013-10-28 ENCOUNTER — Inpatient Hospital Stay (HOSPITAL_COMMUNITY)
Admission: AD | Admit: 2013-10-28 | Discharge: 2013-10-28 | Disposition: A | Discharge status: Home / Self Care | Payer: Self-pay | Source: Ambulatory Visit | Attending: Family Medicine | Admitting: Family Medicine

## 2013-10-28 ENCOUNTER — Encounter (HOSPITAL_COMMUNITY): Payer: Self-pay

## 2013-10-28 DIAGNOSIS — F17200 Nicotine dependence, unspecified, uncomplicated: Secondary | ICD-10-CM | POA: Insufficient documentation

## 2013-10-28 DIAGNOSIS — R1110 Vomiting, unspecified: Secondary | ICD-10-CM | POA: Insufficient documentation

## 2013-10-28 DIAGNOSIS — R109 Unspecified abdominal pain: Secondary | ICD-10-CM | POA: Insufficient documentation

## 2013-10-28 DIAGNOSIS — N390 Urinary tract infection, site not specified: Secondary | ICD-10-CM | POA: Insufficient documentation

## 2013-10-28 LAB — CBC
HCT: 37 % (ref 36.0–46.0)
Hemoglobin: 12.2 g/dL (ref 12.0–15.0)
MCH: 30.9 pg (ref 26.0–34.0)
MCHC: 33 g/dL (ref 30.0–36.0)
MCV: 93.7 fL (ref 78.0–100.0)
Platelets: 190 10*3/uL (ref 150–400)
RBC: 3.95 MIL/uL (ref 3.87–5.11)
RDW: 15.4 % (ref 11.5–15.5)
WBC: 7.1 10*3/uL (ref 4.0–10.5)

## 2013-10-28 LAB — URINALYSIS, ROUTINE W REFLEX MICROSCOPIC
Bilirubin Urine: NEGATIVE
Glucose, UA: NEGATIVE mg/dL
Hgb urine dipstick: NEGATIVE
Ketones, ur: NEGATIVE mg/dL
Leukocytes, UA: NEGATIVE
Nitrite: POSITIVE — AB
Protein, ur: NEGATIVE mg/dL
Specific Gravity, Urine: 1.015 (ref 1.005–1.030)
Urobilinogen, UA: 0.2 mg/dL (ref 0.0–1.0)
pH: 7 (ref 5.0–8.0)

## 2013-10-28 LAB — URINE MICROSCOPIC-ADD ON

## 2013-10-28 LAB — POCT PREGNANCY, URINE: Preg Test, Ur: NEGATIVE

## 2013-10-28 MED ORDER — PROCHLORPERAZINE MALEATE 10 MG PO TABS: 10.0000 mg | ORAL_TABLET | Freq: Two times a day (BID) | ORAL | Status: DC | PRN

## 2013-10-28 MED ORDER — SULFAMETHOXAZOLE-TRIMETHOPRIM 800-160 MG PO TABS: 1.0000 | ORAL_TABLET | Freq: Two times a day (BID) | ORAL | Status: DC

## 2013-10-28 NOTE — MAU Provider Note (Signed)
  History     CSN: [id number]  Arrival date and time: 10/28/13 1640   None     Chief Complaint  Patient presents with  . Vomiting  . Abdominal Cramping   HPI 23 yo female here with 3 day history of vomiting stomach contents with flecks of red. She states that no one else in the family has been sick. She is able to tolerate fruits, muffins, liquids without vomiting. All other foods tend to make her vomiting worse. She does complain of some pelvic pressure, but denies dysuria, frequency, urgency.   Past Medical History  Diagnosis Date  . Migraines     Past Surgical History  Procedure Laterality Date  . Vaginal hematoma  2010    after birth, to OR  . Dilation and evacuation N/A 11/21/2012    Procedure: DILATATION AND EVACUATION;  Surgeon: Catalina Antigua, MD;  Location: [facility];  Service: Gynecology;  Laterality: N/A;    Family History  Problem Relation Age of Onset  . Other Neg Hx   . Hypertension Mother   . Anxiety disorder Mother     History  Substance Use Topics  . Smoking status: Current Some Day Smoker -- 1.00 packs/day  . Smokeless tobacco: Never Used  . Alcohol Use: Yes     Comment: Socially    Allergies:  Allergies  Allergen Reactions  . Other Anaphylaxis    seafood  . Shellfish Allergy     Throat swells and difficulty breathing    Facility-administered medications prior to admission  Medication Dose Route Frequency Provider Last Rate Last Dose  . oxyCODONE-acetaminophen (PERCOCET/ROXICET) 5-325 MG per tablet 1-2 tablet  1-2 tablet Oral Q3H PRN Brock Bad, MD       No prescriptions prior to admission    ROS Physical Exam   Blood pressure 106/61, pulse 63, temperature 98.1 F (36.7 C), temperature source Oral, resp. rate 18, height 5' 5.25" (1.657 m), weight 76.658 kg (169 lb), last menstrual period 10/11/2013.  Physical Exam  Constitutional: She is oriented to person, place, and time. She appears well-developed and well-nourished.  HENT:   Head: Normocephalic.  Cardiovascular: Normal rate, regular rhythm and normal heart sounds.   Respiratory: Effort normal and breath sounds normal. No respiratory distress. She has no wheezes. She has no rales. She exhibits no tenderness.  GI: Soft. Bowel sounds are normal. She exhibits no distension. There is tenderness (suprapubic).  Neurological: She is alert and oriented to person, place, and time.  Skin: Skin is warm and dry. No rash noted. No erythema. No pallor.  Psychiatric: She has a normal mood and affect. Her behavior is normal. Judgment and thought content normal.   Urine dipstick shows positive for WBC's and positive for nitrates.  Micro exam: WBC's per HPF and + bacteria.  Urine pregnancy test was negative.  MAU Course  Procedures  MDM I consider gastroenteritis, upper GI bleed, pregnancy,   Assessment and Plan  #1  UTI UTI may be causing nausea. We'll prescribe cefadroxil with Compazine and sent the urine off for culture. Patient instructed to followup with primary care physician  Candelaria Celeste JEHIEL 10/28/2013, 8:52 PM

## 2013-10-28 NOTE — Discharge Instructions (Signed)

## 2013-10-28 NOTE — MAU Note (Signed)
Cramping in lower abd, been throwing up blood and green stuff for 3 days, getting worse

## 2013-10-31 LAB — URINE CULTURE: Colony Count: >=100000

## 2013-12-22 ENCOUNTER — Encounter (HOSPITAL_COMMUNITY): Payer: Self-pay

## 2014-10-19 ENCOUNTER — Encounter (HOSPITAL_COMMUNITY): Payer: Self-pay | Admitting: *Deleted

## 2014-10-19 ENCOUNTER — Inpatient Hospital Stay (HOSPITAL_COMMUNITY)
Admission: AD | Admit: 2014-10-19 | Discharge: 2014-10-19 | Disposition: A | Discharge status: Home / Self Care | Payer: Self-pay | Source: Ambulatory Visit | Attending: Obstetrics | Admitting: Obstetrics

## 2014-10-19 DIAGNOSIS — R112 Nausea with vomiting, unspecified: Secondary | ICD-10-CM | POA: Insufficient documentation

## 2014-10-19 DIAGNOSIS — Z3202 Encounter for pregnancy test, result negative: Secondary | ICD-10-CM | POA: Insufficient documentation

## 2014-10-19 DIAGNOSIS — F17210 Nicotine dependence, cigarettes, uncomplicated: Secondary | ICD-10-CM | POA: Insufficient documentation

## 2014-10-19 DIAGNOSIS — N760 Acute vaginitis: Secondary | ICD-10-CM | POA: Insufficient documentation

## 2014-10-19 DIAGNOSIS — A499 Bacterial infection, unspecified: Secondary | ICD-10-CM

## 2014-10-19 DIAGNOSIS — B9689 Other specified bacterial agents as the cause of diseases classified elsewhere: Secondary | ICD-10-CM

## 2014-10-19 LAB — URINALYSIS, ROUTINE W REFLEX MICROSCOPIC
Bilirubin Urine: NEGATIVE
Glucose, UA: NEGATIVE mg/dL
Hgb urine dipstick: NEGATIVE
Ketones, ur: NEGATIVE mg/dL
Leukocytes, UA: NEGATIVE
Nitrite: NEGATIVE
Protein, ur: NEGATIVE mg/dL
Specific Gravity, Urine: >1.03 — ABNORMAL HIGH (ref 1.005–1.030)
Urobilinogen, UA: 0.2 mg/dL (ref 0.0–1.0)
pH: 5.5 (ref 5.0–8.0)

## 2014-10-19 LAB — WET PREP, GENITAL
Trich, Wet Prep: NONE SEEN
Yeast Wet Prep HPF POC: NONE SEEN

## 2014-10-19 LAB — POCT PREGNANCY, URINE: Preg Test, Ur: NEGATIVE

## 2014-10-19 MED ORDER — METRONIDAZOLE 500 MG PO TABS
500.0000 mg | ORAL_TABLET | Freq: Two times a day (BID) | ORAL | Status: DC
Start: 2014-10-19 — End: 2015-04-19

## 2014-10-19 MED ORDER — ONDANSETRON 8 MG PO TBDP: 8.0000 mg | ORAL_TABLET | Freq: Three times a day (TID) | ORAL | Status: DC | PRN

## 2014-10-19 MED ORDER — ONDANSETRON 8 MG PO TBDP: 8.0000 mg | ORAL_TABLET | Freq: Once | ORAL | Status: DC

## 2014-10-19 NOTE — MAU Provider Note (Signed)
History     CSN: [id number]  Arrival date and time: 10/19/14 1651   None     Chief Complaint  Patient presents with  . Abdominal Pain  . Nausea  . Vaginal Bleeding   HPI pt is not pregnant- has not done a pregnancy test and home but thought she might be pregnant b/c she is having some spotting along with some nausea and vomiting that started 2 days ago. Pt had IC this morning without any abdominal pain or spotting/bleeding. Today, pt is having only a small amount of spotting. Pt is not using anything for contraception and says she is trying to get pregnant. RN note: Is having some spotting, period is not due until the 5th. Is throwing up, is having abd pains. All started 2 days ago.  Past Medical History  Diagnosis Date  . Migraines     Past Surgical History  Procedure Laterality Date  . Vaginal hematoma  2010    after birth, to OR  . Dilation and evacuation N/A 11/21/2012    Procedure: DILATATION AND EVACUATION;  Surgeon: Catalina Antigua, MD;  Location: [facility];  Service: Gynecology;  Laterality: N/A;    Family History  Problem Relation Age of Onset  . Other Neg Hx   . Hypertension Mother   . Anxiety disorder Mother     Social History  Substance Use Topics  . Smoking status: Current Some Day Smoker -- 1.00 packs/day  . Smokeless tobacco: Never Used  . Alcohol Use: Yes     Comment: Socially    Allergies:  Allergies  Allergen Reactions  . Other Anaphylaxis    seafood  . Shellfish Allergy     Throat swells and difficulty breathing    Prescriptions prior to admission  Medication Sig Dispense Refill Last Dose  . prochlorperazine (COMPAZINE) 10 MG tablet Take 1 tablet (10 mg total) by mouth 2 (two) times daily as needed for nausea or vomiting. 10 tablet 0   . sulfamethoxazole-trimethoprim (SEPTRA DS) 800-160 MG per tablet Take 1 tablet by mouth every 12 (twelve) hours. 6 tablet 0     Review of Systems  Constitutional: Negative for fever and chills.   Gastrointestinal: Positive for nausea, vomiting and abdominal pain. Negative for diarrhea and constipation.  Genitourinary: Negative for dysuria and urgency.  Neurological: Negative for headaches.   Physical Exam   Blood pressure 113/73, pulse 97, temperature 98.5 F (36.9 C), temperature source Oral, resp. rate 18, height  (1.676 m), weight 172 lb 3.2 oz (78.109 kg), last menstrual period 08/23/2014.  Physical Exam  Nursing note and vitals reviewed. Constitutional: She is oriented to person, place, and time. She appears well-developed and well-nourished. No distress.  HENT:  Head: Normocephalic.  Eyes: Pupils are equal, round, and reactive to light.  Neck: Normal range of motion. Neck supple.  Cardiovascular: Normal rate.   Respiratory: Effort normal.  GI: Soft. She exhibits no distension. There is no tenderness. There is no rebound and no guarding.  Genitourinary:  Vaginal mucosa clean, vagina- small amount of watery discharge in vault; cervix clean, nullip. Uterus deep in pelvis, NSSC, adnexa without palpable enlargement or tenderness  Musculoskeletal: Normal range of motion.  Neurological: She is alert and oriented to person, place, and time.  Skin: Skin is warm and dry.  Psychiatric: She has a normal mood and affect.    MAU Course  Procedures Results for orders placed or performed during the hospital encounter of 10/19/14 (from the past 24 hour(s))  Urinalysis, Routine w reflex microscopic (not at [facility])     Status: Abnormal   Collection Time: 10/19/14  5:20 PM  Result Value Ref Range   Color, Urine YELLOW YELLOW   APPearance CLEAR CLEAR   Specific Gravity, Urine >1.030 (H) 1.005 - 1.030   pH 5.5 5.0 - 8.0   Glucose, UA NEGATIVE NEGATIVE mg/dL   Hgb urine dipstick NEGATIVE NEGATIVE   Bilirubin Urine NEGATIVE NEGATIVE   Ketones, ur NEGATIVE NEGATIVE mg/dL   Protein, ur NEGATIVE NEGATIVE mg/dL   Urobilinogen, UA 0.2 0.0 - 1.0 mg/dL   Nitrite NEGATIVE NEGATIVE    Leukocytes, UA NEGATIVE NEGATIVE  Pregnancy, urine POC     Status: None   Collection Time: 10/19/14  5:27 PM  Result Value Ref Range   Preg Test, Ur NEGATIVE NEGATIVE  Wet prep, genital     Status: Abnormal   Collection Time: 10/19/14  6:02 PM  Result Value Ref Range   Yeast Wet Prep HPF POC NONE SEEN NONE SEEN   Trich, Wet Prep NONE SEEN NONE SEEN   Clue Cells Wet Prep HPF POC MODERATE (A) NONE SEEN   WBC, Wet Prep HPF POC FEW (A) NONE SEEN  Zofran 8mg  ODT given for nausea which helped  Assessment and Plan  Bacterial vaginosis- Rx Flagyl 500mg  Nausea-Rx Zofran- BRAT diet recommended Pt to return if increase in pain, nausea or vomiting, chills or fever HPT is late for menses and thinks she is pregnant F/u with Dr. Rubin Payor 10/19/2014, 5:56 PM

## 2014-10-19 NOTE — MAU Note (Signed)
Is having some spotting, period is not due until the 5th.  Is throwing up, is having abd pains.  All started 2 days ago.

## 2014-10-20 LAB — GC/CHLAMYDIA PROBE AMP (~~LOC~~) NOT AT ARMC
Chlamydia: POSITIVE — AB
Neisseria Gonorrhea: NEGATIVE

## 2014-10-21 ENCOUNTER — Other Ambulatory Visit: Payer: Self-pay | Admitting: Obstetrics

## 2014-10-21 DIAGNOSIS — A5609 Other chlamydial infection of lower genitourinary tract: Secondary | ICD-10-CM

## 2014-10-21 MED ORDER — AZITHROMYCIN 250 MG PO TABS: 1000.0000 mg | ORAL_TABLET | Freq: Once | ORAL | Status: DC

## 2014-10-21 MED ORDER — CEFIXIME 400 MG PO TABS: 400.0000 mg | ORAL_TABLET | Freq: Every day | ORAL | Status: DC

## 2014-11-04 ENCOUNTER — Telehealth (HOSPITAL_COMMUNITY): Payer: Self-pay | Admitting: *Deleted

## 2014-11-04 NOTE — Telephone Encounter (Signed)
Patient called our [facility] office this am asking for me.  Returned call to patient x 2 attempts, no answer, left voice mails.  I have have been trying to reach patient since 10/20/14 regarding a positive chlamydia culture.  Treatment was prescribed by Dr. Clearance Coots, no note indicating if patient has every been notified.

## 2014-11-05 NOTE — Telephone Encounter (Signed)
Patient returned call, notified her of positive chlamydia culture.  Notified her that Dr. Clearance Coots had already sent Rx's in to her pharmacy.  Patient was concerned if she would be able to afford medication.  Gave patient number to [facility] [phone] for treatment option.

## 2015-02-18 ENCOUNTER — Encounter (HOSPITAL_COMMUNITY): Payer: Self-pay | Admitting: *Deleted

## 2015-02-18 ENCOUNTER — Inpatient Hospital Stay (HOSPITAL_COMMUNITY)
Admission: AD | Admit: 2015-02-18 | Discharge: 2015-02-18 | Disposition: A | Discharge status: Home / Self Care | Payer: Self-pay | Source: Ambulatory Visit | Attending: Obstetrics | Admitting: Obstetrics

## 2015-02-18 DIAGNOSIS — K529 Noninfective gastroenteritis and colitis, unspecified: Secondary | ICD-10-CM

## 2015-02-18 DIAGNOSIS — Z91013 Allergy to seafood: Secondary | ICD-10-CM | POA: Insufficient documentation

## 2015-02-18 DIAGNOSIS — A749 Chlamydial infection, unspecified: Secondary | ICD-10-CM | POA: Diagnosis present

## 2015-02-18 DIAGNOSIS — O98819 Other maternal infectious and parasitic diseases complicating pregnancy, unspecified trimester: Secondary | ICD-10-CM | POA: Diagnosis present

## 2015-02-18 DIAGNOSIS — Z3A16 16 weeks gestation of pregnancy: Secondary | ICD-10-CM | POA: Insufficient documentation

## 2015-02-18 DIAGNOSIS — O219 Vomiting of pregnancy, unspecified: Secondary | ICD-10-CM | POA: Insufficient documentation

## 2015-02-18 DIAGNOSIS — N760 Acute vaginitis: Secondary | ICD-10-CM | POA: Insufficient documentation

## 2015-02-18 DIAGNOSIS — F17200 Nicotine dependence, unspecified, uncomplicated: Secondary | ICD-10-CM | POA: Insufficient documentation

## 2015-02-18 DIAGNOSIS — O26852 Spotting complicating pregnancy, second trimester: Secondary | ICD-10-CM | POA: Insufficient documentation

## 2015-02-18 DIAGNOSIS — R197 Diarrhea, unspecified: Secondary | ICD-10-CM | POA: Insufficient documentation

## 2015-02-18 DIAGNOSIS — R109 Unspecified abdominal pain: Secondary | ICD-10-CM | POA: Insufficient documentation

## 2015-02-18 DIAGNOSIS — A562 Chlamydial infection of genitourinary tract, unspecified: Secondary | ICD-10-CM

## 2015-02-18 DIAGNOSIS — B9689 Other specified bacterial agents as the cause of diseases classified elsewhere: Secondary | ICD-10-CM

## 2015-02-18 LAB — URINALYSIS, ROUTINE W REFLEX MICROSCOPIC
Bilirubin Urine: NEGATIVE
Glucose, UA: NEGATIVE mg/dL
Hgb urine dipstick: NEGATIVE
Ketones, ur: NEGATIVE mg/dL
Leukocytes, UA: NEGATIVE
Nitrite: NEGATIVE
Protein, ur: NEGATIVE mg/dL
Specific Gravity, Urine: 1.01 (ref 1.005–1.030)
pH: 6 (ref 5.0–8.0)

## 2015-02-18 LAB — WET PREP, GENITAL
Sperm: NONE SEEN
Trich, Wet Prep: NONE SEEN
Yeast Wet Prep HPF POC: NONE SEEN

## 2015-02-18 LAB — POCT PREGNANCY, URINE: Preg Test, Ur: POSITIVE — AB

## 2015-02-18 MED ORDER — PROMETHAZINE HCL 25 MG PO TABS: 25.0000 mg | ORAL_TABLET | Freq: Four times a day (QID) | ORAL | Status: DC | PRN

## 2015-02-18 MED ORDER — METRONIDAZOLE 500 MG PO TABS: 500.0000 mg | ORAL_TABLET | Freq: Two times a day (BID) | ORAL | Status: DC

## 2015-02-18 NOTE — Discharge Instructions (Signed)
Bacterial Vaginosis °Bacterial vaginosis is a vaginal infection that occurs when the normal balance of bacteria in the vagina is disrupted. It results from an overgrowth of certain bacteria. This is the most common vaginal infection in women of childbearing age. Treatment is important to prevent complications, especially in pregnant women, as it can cause a premature delivery. °CAUSES  °Bacterial vaginosis is caused by an increase in harmful bacteria that are normally present in smaller amounts in the vagina. Several different kinds of bacteria can cause bacterial vaginosis. However, the reason that the condition develops is not fully understood. °RISK FACTORS °Certain activities or behaviors can put you at an increased risk of developing bacterial vaginosis, including: °· Having a new sex partner or multiple sex partners. °· Douching. °· Using an intrauterine device (IUD) for contraception. °Women do not get bacterial vaginosis from toilet seats, bedding, swimming pools, or contact with objects around them. °SIGNS AND SYMPTOMS  °Some women with bacterial vaginosis have no signs or symptoms. Common symptoms include: °· Grey vaginal discharge. °· A fishlike odor with discharge, especially after sexual intercourse. °· Itching or burning of the vagina and vulva. °· Burning or pain with urination. °DIAGNOSIS  °Your health care provider will take a medical history and examine the vagina for signs of bacterial vaginosis. A sample of vaginal fluid may be taken. Your health care provider will look at this sample under a microscope to check for bacteria and abnormal cells. A vaginal pH test may also be done.  °TREATMENT  °Bacterial vaginosis may be treated with antibiotic medicines. These may be given in the form of a pill or a vaginal cream. A second round of antibiotics may be prescribed if the condition comes back after treatment. Because bacterial vaginosis increases your risk for sexually transmitted diseases, getting  treated can help reduce your risk for chlamydia, gonorrhea, HIV, and herpes. °HOME CARE INSTRUCTIONS  °· Only take over-the-counter or prescription medicines as directed by your health care provider. °· If antibiotic medicine was prescribed, take it as directed. Make sure you finish it even if you start to feel better. °· Tell all sexual partners that you have a vaginal infection. They should see their health care provider and be treated if they have problems, such as a mild rash or itching. °· During treatment, it is important that you follow these instructions: °¨ Avoid sexual activity or use condoms correctly. °¨ Do not douche. °¨ Avoid alcohol as directed by your health care provider. °¨ Avoid breastfeeding as directed by your health care provider. °SEEK MEDICAL CARE IF:  °· Your symptoms are not improving after 3 days of treatment. °· You have increased discharge or pain. °· You have a fever. °MAKE SURE YOU:  °· Understand these instructions. °· Will watch your condition. °· Will get help right away if you are not doing well or get worse. °FOR MORE INFORMATION  °Centers for Disease Control and Prevention, Division of STD Prevention: www.cdc.gov/std °American Sexual Health Association (ASHA): www.ashastd.org  °  °This information is not intended to replace advice given to you by your health care provider. Make sure you discuss any questions you have with your health care provider. °  °Document Released: 02/06/2005 Document Revised: 02/27/2014 Document Reviewed: 09/18/2012 °Elsevier Interactive Patient Education ©2016 [employer] ° °Viral Gastroenteritis  °Viral gastroenteritis is also known as stomach flu. This condition affects the stomach and intestinal tract. It can cause sudden diarrhea and vomiting. The illness typically lasts 3 to 8 days. Most people   develop an immune response that eventually gets rid of the virus. While this natural response develops, the virus can make you quite ill.  °CAUSES  °Many  different viruses can cause gastroenteritis, such as rotavirus or noroviruses. You can catch one of these viruses by consuming contaminated food or water. You may also catch a virus by sharing utensils or other personal items with an infected person or by touching a contaminated surface.  °SYMPTOMS  °The most common symptoms are diarrhea and vomiting. These problems can cause a severe loss of body fluids (dehydration) and a body salt (electrolyte) imbalance. Other symptoms may include:  °Fever.  °Headache.  °Fatigue.  °Abdominal pain. °DIAGNOSIS  °Your caregiver can usually diagnose viral gastroenteritis based on your symptoms and a physical exam. A stool sample may also be taken to test for the presence of viruses or other infections.  °TREATMENT  °This illness typically goes away on its own. Treatments are aimed at rehydration. The most serious cases of viral gastroenteritis involve vomiting so severely that you are not able to keep fluids down. In these cases, fluids must be given through an intravenous line (IV).  °HOME CARE INSTRUCTIONS  °Drink enough fluids to keep your urine clear or pale yellow. Drink small amounts of fluids frequently and increase the amounts as tolerated.  °Ask your caregiver for specific rehydration instructions.  °Avoid:  °Foods high in sugar.  °Alcohol.  °Carbonated drinks.  °Tobacco.  °Juice.  °Caffeine drinks.  °Extremely hot or cold fluids.  °Fatty, greasy foods.  °Too much intake of anything at one time.  °Dairy products until 24 to 48 hours after diarrhea stops. °You may consume probiotics. Probiotics are active cultures of beneficial bacteria. They may lessen the amount and number of diarrheal stools in adults. Probiotics can be found in yogurt with active cultures and in supplements.  °Wash your hands well to avoid spreading the virus.  °Only take over-the-counter or prescription medicines for pain, discomfort, or fever as directed by your caregiver. Do not give aspirin to  children. Antidiarrheal medicines are not recommended.  °Ask your caregiver if you should continue to take your regular prescribed and over-the-counter medicines.  °Keep all follow-up appointments as directed by your caregiver. °SEEK IMMEDIATE MEDICAL CARE IF:  °You are unable to keep fluids down.  °You do not urinate at least once every 6 to 8 hours.  °You develop shortness of breath.  °You notice blood in your stool or vomit. This may look like coffee grounds.  °You have abdominal pain that increases or is concentrated in one small area (localized).  °You have persistent vomiting or diarrhea.  °You have a fever.  °The patient is a child younger than 3 months, and he or she has a fever.  °The patient is a child older than 3 months, and he or she has a fever and persistent symptoms.  °The patient is a child older than 3 months, and he or she has a fever and symptoms suddenly get worse.  °The patient is a baby, and he or she has no tears when crying. °MAKE SURE YOU:  °Understand these instructions.  °Will watch your condition.  °Will get help right away if you are not doing well or get worse. °This information is not intended to replace advice given to you by your health care provider. Make sure you discuss any questions you have with your health care provider.  °Document Released: 02/06/2005 Document Revised: 05/01/2011 Document Reviewed: 11/23/2010  °Elsevier Interactive Patient   Patient Education 2016 [employer].

## 2015-02-18 NOTE — MAU Provider Note (Signed)
Chief Complaint: Abdominal Pain and Emesis   First Provider Initiated Contact with Patient 02/18/15 1803     SUBJECTIVE HPI: Amber Griffin is a 24 y.o. [id number] at 4428w4d by LMP who presents to maternity admissions reporting Vomiting and diarrhea. Diarrhea lasted 3 days but stopped. Also had some spotting. . She denies vaginal itching/burning, urinary symptoms, h/a, dizziness, or fever/chills.    + chlamydia in September, already treated  RN Note: Pt C/O vomiting, spotting, abdominal pain for the last 2 weeks. Had diarrhea for 72 hours, none today. Denies dysuria.         Emesis  This is a new problem. The current episode started in the past 7 days. The problem occurs 2 to 4 times per day. The problem has been gradually improving. There has been no fever. Associated symptoms include abdominal pain and diarrhea. Pertinent negatives include no arthralgias, chills, coughing, dizziness, fever, headaches or myalgias. Risk factors include ill contacts. She has tried nothing for the symptoms.    Past Medical History  Diagnosis Date  . Migraines    Past Surgical History  Procedure Laterality Date  . Vaginal hematoma  2010    after birth, to OR  . Dilation and evacuation N/A 11/21/2012    Procedure: DILATATION AND EVACUATION;  Surgeon: Catalina AntiguaPeggy Constant, MD;  Location: [facility];  Service: Gynecology;  Laterality: N/A;   Social History   Social History  . Marital Status: Single    Spouse Name: N/A  . Number of Children: N/A  . Years of Education: N/A   Occupational History  . Not on file.   Social History Main Topics  . Smoking status: Current Some Day Smoker -- 1.00 packs/day  . Smokeless tobacco: Never Used  . Alcohol Use: Yes     Comment: Socially  . Drug Use: No  . Sexual Activity: Not Currently    Birth Control/ Protection: None   Other Topics Concern  . Not on file   Social History Narrative   No current facility-administered medications on file prior to  encounter.   Current Outpatient Prescriptions on File Prior to Encounter  Medication Sig Dispense Refill  . azithromycin (ZITHROMAX) 250 MG tablet Take 4 tablets (1,000 mg total) by mouth once. 4 tablet 0  . cefixime (SUPRAX) 400 MG tablet Take 1 tablet (400 mg total) by mouth daily. 1 tablet 0  . citalopram (CELEXA) 20 MG tablet Take 20 mg by mouth at bedtime.    . metroNIDAZOLE (FLAGYL) 500 MG tablet Take 1 tablet (500 mg total) by mouth 2 (two) times daily. 14 tablet 0  . ondansetron (ZOFRAN ODT) 8 MG disintegrating tablet Take 1 tablet (8 mg total) by mouth every 8 (eight) hours as needed for nausea or vomiting. 20 tablet 0   Allergies  Allergen Reactions  . Other Anaphylaxis    seafood  . Shellfish Allergy     Throat swells and difficulty breathing, said has had since with no issues    ROS:  Review of Systems  Constitutional: Positive for fatigue. Negative for fever, chills and appetite change.  Respiratory: Negative for cough.   Gastrointestinal: Positive for nausea, vomiting, abdominal pain and diarrhea. Negative for constipation.  Genitourinary: Positive for vaginal bleeding (spotting). Negative for frequency and difficulty urinating.  Musculoskeletal: Negative for myalgias, back pain and arthralgias.  Neurological: Negative for dizziness and headaches.   Review of Systems  Constitutional: Negative for fever and chills.  Gastrointestinal: Negative for nausea, vomiting, abdominal pain, diarrhea and constipation.  Genitourinary: Negative for dysuria.  Musculoskeletal: Negative for back pain.  Neurological: Negative for dizziness and weakness.  Other systems negative   I have reviewed patient's Past Medical Hx, Surgical Hx, Family Hx, Social Hx, medications and allergies.   Physical Exam  Patient Vitals for the past 24 hrs:  BP Temp Temp src Pulse Resp  02/18/15 1612 116/68 mmHg 97.7 F (36.5 C) Oral 81 18   Constitutional: Well-developed, female in no acute distress.   Cardiovascular: normal rate and rhythm Respiratory: normal effort, clear bilaterally GI: Abd soft, slightly tender diffusely. Pos BS x 4 MS: Extremities nontender, no edema, normal ROM Neurologic: Alert and oriented x 4.  GU: Neg CVAT.  PELVIC EXAM: Cervix 0/long/high, firm, anterior, neg CMT, uterus nontender, adnexa without tenderness, enlargement, or mass     No blood seen on speculum exam   Cultures and wet prep obtained   Fundal height difficult to palpate but I estimate 14 weeks  FHT 164 by doppler  Physical Exam LAB RESULTS Results for orders placed or performed during the hospital encounter of 02/18/15 (from the past 24 hour(s))  Urinalysis, Routine w reflex microscopic (not at [facility])     Status: None   Collection Time: 02/18/15  4:17 PM  Result Value Ref Range   Color, Urine YELLOW YELLOW   APPearance CLEAR CLEAR   Specific Gravity, Urine 1.010 1.005 - 1.030   pH 6.0 5.0 - 8.0   Glucose, UA NEGATIVE NEGATIVE mg/dL   Hgb urine dipstick NEGATIVE NEGATIVE   Bilirubin Urine NEGATIVE NEGATIVE   Ketones, ur NEGATIVE NEGATIVE mg/dL   Protein, ur NEGATIVE NEGATIVE mg/dL   Nitrite NEGATIVE NEGATIVE   Leukocytes, UA NEGATIVE NEGATIVE  Pregnancy, urine POC     Status: Abnormal   Collection Time: 02/18/15  4:54 PM  Result Value Ref Range   Preg Test, Ur POSITIVE (A) NEGATIVE  Wet prep, genital     Status: Abnormal   Collection Time: 02/18/15  6:00 PM  Result Value Ref Range   Yeast Wet Prep HPF POC NONE SEEN NONE SEEN   Trich, Wet Prep NONE SEEN NONE SEEN   Clue Cells Wet Prep HPF POC PRESENT (A) NONE SEEN   WBC, Wet Prep HPF POC FEW (A) NONE SEEN   Sperm NONE SEEN      IMAGING No results found.  MAU Management/MDM: Ordered labs, collected cultures and reviewed results.   Pt stable at time of discharge.   ASSESSMENT SIUP at [redacted]w[redacted]d by LMP, 14 weeks by FH Spotting in second trimester No prenatal care, has appt with Dr Clearance Coots Nausea and vomiting  PLAN Discharge  home Rx Flagyl for BV Already treated for Other STDs at ED Follow up as scheduled for prenatal new ob visit    Medication List    ASK your doctor about these medications        azithromycin 250 MG tablet  Commonly known as:  ZITHROMAX  Take 4 tablets (1,000 mg total) by mouth once.     cefixime 400 MG tablet  Commonly known as:  SUPRAX  Take 1 tablet (400 mg total) by mouth daily.     citalopram 20 MG tablet  Commonly known as:  CELEXA  Take 20 mg by mouth at bedtime.     metroNIDAZOLE 500 MG tablet  Commonly known as:  FLAGYL  Take 1 tablet (500 mg total) by mouth 2 (two) times daily.     ondansetron 8 MG disintegrating tablet  Commonly known as:  ZOFRAN ODT  Take 1 tablet (8 mg total) by mouth every 8 (eight) hours as needed for nausea or vomiting.         Wynelle Bourgeois CNM, MSN Certified Nurse-Midwife 02/18/2015  7:18 PM

## 2015-02-18 NOTE — MAU Note (Signed)
Pt C/O vomiting, spotting, abdominal pain for the last 2 weeks.  Had diarrhea for 72 hours, none today.  Denies dysuria.

## 2015-02-20 ENCOUNTER — Other Ambulatory Visit: Payer: Self-pay | Admitting: Obstetrics

## 2015-02-20 DIAGNOSIS — A5609 Other chlamydial infection of lower genitourinary tract: Secondary | ICD-10-CM

## 2015-02-20 LAB — GC/CHLAMYDIA PROBE AMP (~~LOC~~) NOT AT ARMC
Chlamydia: POSITIVE — AB
Neisseria Gonorrhea: NEGATIVE

## 2015-02-20 MED ORDER — CEFIXIME 400 MG PO TABS: 400.0000 mg | ORAL_TABLET | Freq: Once | ORAL | Status: DC

## 2015-02-20 MED ORDER — AZITHROMYCIN 250 MG PO TABS: 1000.0000 mg | ORAL_TABLET | Freq: Once | ORAL | Status: DC

## 2015-02-21 DIAGNOSIS — O98819 Other maternal infectious and parasitic diseases complicating pregnancy, unspecified trimester: Secondary | ICD-10-CM

## 2015-02-21 DIAGNOSIS — A749 Chlamydial infection, unspecified: Secondary | ICD-10-CM

## 2015-02-21 HISTORY — DX: Chlamydial infection, unspecified: A74.9

## 2015-02-21 NOTE — L&D Delivery Note (Signed)
Delivery Note At 5:24 AM a viable female was delivered via Vaginal, Spontaneous Delivery (Presentation: Right Occiput Anterior).  APGAR: 8, 9; weight 4 lb 5.5 oz (1970 g).   Placenta status: Intact, Spontaneous.  Cord: 3 vessels with the following complications: None.  Cord pH: none  Anesthesia: Epidural  Episiotomy: None Lacerations: None Suture Repair: none Est. Blood Loss (mL): 100  Mom to postpartum.  Baby to NICU for prematurity  Amber Griffin A 06/21/2015, 7:59 AM

## 2015-02-22 ENCOUNTER — Emergency Department (HOSPITAL_COMMUNITY)
Admission: EM | Admit: 2015-02-22 | Discharge: 2015-02-22 | Disposition: A | Discharge status: Home / Self Care | Payer: Self-pay | Attending: Emergency Medicine | Admitting: Emergency Medicine

## 2015-02-22 ENCOUNTER — Encounter (HOSPITAL_COMMUNITY): Payer: Self-pay | Admitting: *Deleted

## 2015-02-22 DIAGNOSIS — H109 Unspecified conjunctivitis: Secondary | ICD-10-CM | POA: Insufficient documentation

## 2015-02-22 DIAGNOSIS — F17200 Nicotine dependence, unspecified, uncomplicated: Secondary | ICD-10-CM | POA: Insufficient documentation

## 2015-02-22 DIAGNOSIS — Z8679 Personal history of other diseases of the circulatory system: Secondary | ICD-10-CM | POA: Insufficient documentation

## 2015-02-22 DIAGNOSIS — Z79899 Other long term (current) drug therapy: Secondary | ICD-10-CM | POA: Insufficient documentation

## 2015-02-22 DIAGNOSIS — Z792 Long term (current) use of antibiotics: Secondary | ICD-10-CM | POA: Insufficient documentation

## 2015-02-22 MED ORDER — POLYMYXIN B-TRIMETHOPRIM 10000-0.1 UNIT/ML-% OP SOLN: 1.0000 [drp] | OPHTHALMIC | Status: DC

## 2015-02-22 MED ORDER — ERYTHROMYCIN 5 MG/GM OP OINT: TOPICAL_OINTMENT | OPHTHALMIC | Status: DC

## 2015-02-22 NOTE — Discharge Instructions (Signed)
Conjunctivitis °Conjunctivitis, commonly called pink eye, is an inflammation of the clear membrane that covers the white part of the eye (conjunctiva). The inflammation can also happen on the underside of the eyelids. The blood vessels in the conjunctiva become inflamed, causing the eye to become red or pink. Bacterial conjunctivitis may spread easily from one eye to another and from person to person (contagious).  °CAUSES  °Bacterial conjunctivitis is caused by bacteria. The bacteria may come from your own skin, your upper respiratory tract, or from someone else with bacterial conjunctivitis. °SYMPTOMS  °The normally white color of the eye or the underside of the eyelid is usually pink or red. The pink eye is usually associated with irritation, tearing, and some sensitivity to light. Bacterial conjunctivitis is often associated with a thick, yellowish discharge from the eye. The discharge may turn into a crust on the eyelids overnight, which causes your eyelids to stick together. If a discharge is present, there may also be some blurred vision in the affected eye. °DIAGNOSIS  °Bacterial conjunctivitis is diagnosed by your caregiver through an eye exam and the symptoms that you report. Your caregiver looks for changes in the surface tissues of your eyes, which may point to the specific type of conjunctivitis. A sample of any discharge may be collected on a cotton-tip swab if you have a severe case of conjunctivitis, if your cornea is affected, or if you keep getting repeat infections that do not respond to treatment. The sample will be sent to a lab to see if the inflammation is caused by a bacterial infection and to see if the infection will respond to antibiotic medicines. °TREATMENT  °· Bacterial conjunctivitis is treated with antibiotics. Antibiotic eyedrops are most often used. However, antibiotic ointments are also available. Antibiotics pills are sometimes used. Artificial tears or eye washes may ease  discomfort. °HOME CARE INSTRUCTIONS  °· To ease discomfort, apply a cool, clean washcloth to your eye for 10-20 minutes, 3-4 times a day. °· Gently wipe away any drainage from your eye with a warm, wet washcloth or a cotton ball. °· Wash your hands often with soap and water. Use paper towels to dry your hands. °· Do not share towels or washcloths. This may spread the infection. °· Change or wash your pillowcase every day. °· You should not use eye makeup until the infection is gone. °· Do not operate machinery or drive if your vision is blurred. °· Stop using contact lenses. Ask your caregiver how to sterilize or replace your contacts before using them again. This depends on the type of contact lenses that you use. °· When applying medicine to the infected eye, do not touch the edge of your eyelid with the eyedrop bottle or ointment tube. °SEEK IMMEDIATE MEDICAL CARE IF:  °· Your infection has not improved within 3 days after beginning treatment. °· You had yellow discharge from your eye and it returns. °· You have increased eye pain. °· Your eye redness is spreading. °· Your vision becomes blurred. °· You have a fever or persistent symptoms for more than 2-3 days. °· You have a fever and your symptoms suddenly get worse. °· You have facial pain, redness, or swelling. °MAKE SURE YOU:  °· Understand these instructions. °· Will watch your condition. °· Will get help right away if you are not doing well or get worse. °  °This information is not intended to replace advice given to you by your health care provider. Make sure you discuss any   questions you have with your health care provider. °  °Document Released: 02/06/2005 Document Revised: 02/27/2014 Document Reviewed: 07/10/2011 °Elsevier Interactive Patient Education ©2016 [employer] ° °

## 2015-02-22 NOTE — ED Notes (Signed)
Patient able to ambulate independently  

## 2015-02-22 NOTE — ED Notes (Signed)
Pt c/o watery, itchy eyes x 2 days. States that she has tried visine and [employer] adviserallegra with no relief. Also states that her vision is now decreasing. States that she has green drainage at times.

## 2015-02-22 NOTE — ED Provider Notes (Signed)
CSN: [id number]     Arrival date & time 02/22/15  1615 History   First MD Initiated Contact with Patient 02/22/15 1631     Chief Complaint  Patient presents with  . Eye Problem     (Consider location/radiation/quality/duration/timing/severity/associated sxs/prior Treatment) HPI Comments: Patient presents emergency department with chief complaint of itchy, red, watery eyes. 7 the symptoms for the past 2 days. She states that her boyfriend has had similar symptoms for the past 3 days. She has tried using Visine and Allegra with no relief. She denies any fevers or chills. She states that she has slight blurred vision. She states that she wakes with her eyes "glued together." She states that it feels like something is in both of her eyes, but she denies getting anything in her eyes. There are no aggravating or alleviating factors.  The history is provided by the patient. No language interpreter was used.    Past Medical History  Diagnosis Date  . Migraines    Past Surgical History  Procedure Laterality Date  . Vaginal hematoma  2010    after birth, to OR  . Dilation and evacuation N/A 11/21/2012    Procedure: DILATATION AND EVACUATION;  Surgeon: Catalina Antigua, MD;  Location: [facility];  Service: Gynecology;  Laterality: N/A;   Family History  Problem Relation Age of Onset  . Other Neg Hx   . Hypertension Mother   . Anxiety disorder Mother    Social History  Substance Use Topics  . Smoking status: Current Some Day Smoker -- 1.00 packs/day  . Smokeless tobacco: Never Used  . Alcohol Use: Yes     Comment: Socially   OB History    Gravida Para Term Preterm AB TAB SAB Ectopic Multiple Living   5 2 2  1 1    2      Review of Systems  Constitutional: Negative for fever and chills.  Eyes: Positive for discharge, redness, itching and visual disturbance.  Respiratory: Negative for shortness of breath.   Cardiovascular: Negative for chest pain.  Gastrointestinal: Negative for nausea,  vomiting, diarrhea and constipation.  Genitourinary: Negative for dysuria.      Allergies  Other and Shellfish allergy  Home Medications   Prior to Admission medications   Medication Sig Start Date End Date Taking? Authorizing Provider  azithromycin (ZITHROMAX) 250 MG tablet Take 4 tablets (1,000 mg total) by mouth once. 02/20/15   Brock Bad, MD  cefixime (SUPRAX) 400 MG tablet Take 1 tablet (400 mg total) by mouth once. 02/20/15   Brock Bad, MD  citalopram (CELEXA) 20 MG tablet Take 20 mg by mouth at bedtime.    Historical Provider, MD  metroNIDAZOLE (FLAGYL) 500 MG tablet Take 1 tablet (500 mg total) by mouth 2 (two) times daily. 10/19/14   Jean Rosenthal, NP  metroNIDAZOLE (FLAGYL) 500 MG tablet Take 1 tablet (500 mg total) by mouth 2 (two) times daily. 02/18/15   Aviva Signs, CNM  ondansetron (ZOFRAN ODT) 8 MG disintegrating tablet Take 1 tablet (8 mg total) by mouth every 8 (eight) hours as needed for nausea or vomiting. 10/19/14   Jean Rosenthal, NP  promethazine (PHENERGAN) 25 MG tablet Take 1 tablet (25 mg total) by mouth every 6 (six) hours as needed for nausea or vomiting. 02/18/15   Aviva Signs, CNM   BP 131/80 mmHg  Pulse 97  Temp(Src) 97.7 F (36.5 C) (Oral)  Resp 18  SpO2 99%  LMP 10/25/2014 Physical Exam  Constitutional: She is oriented to person, place, and time. She appears well-developed and well-nourished.  HENT:  Head: Normocephalic and atraumatic.  Eyes: EOM are normal. Right eye exhibits discharge. Left eye exhibits discharge.  Bilateral conjunctivae are injected and erythematous with mild watery discharge Normal EOMs No foreign body Right Eye Distance: 20/25 Left Eye Distance: 20/30 Bilateral Distance: 20/25    Neck: Normal range of motion.  Cardiovascular: Normal rate.   Pulmonary/Chest: Effort normal.  Abdominal: She exhibits no distension.  Musculoskeletal: Normal range of motion.  Neurological: She is alert and  oriented to person, place, and time.  Skin: Skin is dry.  Psychiatric: She has a normal mood and affect. Her behavior is normal. Judgment and thought content normal.  Nursing note and vitals reviewed.   ED Course  Procedures (including critical care time)   MDM   Final diagnoses:  Bilateral conjunctivitis    Patient with conjunctivitis, will treat with erythromycin, recommend ophthalmology follow-up in 2 days for new or worsening symptoms. Patient understands and agrees to plan. She is stable and ready for discharge.   Roxy HorsemanRobert Dymon [place]hill, PA-C 02/22/15 2107  Donnetta HutchingBrian Cook, MD 02/22/15 2216

## 2015-03-16 ENCOUNTER — Encounter: Payer: Medicaid Other | Admitting: Obstetrics

## 2015-03-17 ENCOUNTER — Other Ambulatory Visit: Payer: Self-pay | Admitting: Certified Nurse Midwife

## 2015-04-07 ENCOUNTER — Encounter: Payer: Medicaid Other | Admitting: Obstetrics

## 2015-04-19 ENCOUNTER — Ambulatory Visit (INDEPENDENT_AMBULATORY_CARE_PROVIDER_SITE_OTHER): Payer: Medicaid Other | Admitting: Obstetrics

## 2015-04-19 ENCOUNTER — Other Ambulatory Visit: Payer: Self-pay | Admitting: Obstetrics

## 2015-04-19 ENCOUNTER — Encounter: Payer: Self-pay | Admitting: Obstetrics

## 2015-04-19 VITALS — BP 100/64 | HR 74 | Temp 98.8°F | Wt 189.8 lb

## 2015-04-19 DIAGNOSIS — Z3492 Encounter for supervision of normal pregnancy, unspecified, second trimester: Secondary | ICD-10-CM | POA: Diagnosis not present

## 2015-04-19 DIAGNOSIS — K219 Gastro-esophageal reflux disease without esophagitis: Secondary | ICD-10-CM

## 2015-04-19 DIAGNOSIS — F329 Major depressive disorder, single episode, unspecified: Secondary | ICD-10-CM

## 2015-04-19 DIAGNOSIS — O2342 Unspecified infection of urinary tract in pregnancy, second trimester: Secondary | ICD-10-CM

## 2015-04-19 DIAGNOSIS — N390 Urinary tract infection, site not specified: Secondary | ICD-10-CM

## 2015-04-19 DIAGNOSIS — F32A Depression, unspecified: Secondary | ICD-10-CM

## 2015-04-19 DIAGNOSIS — O99342 Other mental disorders complicating pregnancy, second trimester: Secondary | ICD-10-CM

## 2015-04-19 LAB — POCT URINALYSIS DIPSTICK
Bilirubin, UA: NEGATIVE
Blood, UA: NEGATIVE
Glucose, UA: NEGATIVE
Ketones, UA: NEGATIVE
Leukocytes, UA: NEGATIVE
Nitrite, UA: POSITIVE
Spec Grav, UA: 1.015
Urobilinogen, UA: NEGATIVE
pH, UA: 6

## 2015-04-19 LAB — HIV ANTIBODY (ROUTINE TESTING W REFLEX): HIV 1&2 Ab, 4th Generation: NONREACTIVE

## 2015-04-19 LAB — OB RESULTS CONSOLE RUBELLA ANTIBODY, IGM: Rubella: IMMUNE

## 2015-04-19 MED ORDER — RANITIDINE HCL 150 MG PO TABS: 150.0000 mg | ORAL_TABLET | Freq: Two times a day (BID) | ORAL | Status: DC

## 2015-04-19 MED ORDER — VITAFOL GUMMIES 3.33-0.333-34.8 MG PO CHEW: 3.0000 | CHEWABLE_TABLET | ORAL | Status: DC

## 2015-04-19 MED ORDER — NITROFURANTOIN MONOHYD MACRO 100 MG PO CAPS: 100.0000 mg | ORAL_CAPSULE | Freq: Two times a day (BID) | ORAL | Status: DC

## 2015-04-19 MED ORDER — SERTRALINE HCL 25 MG PO TABS: 25.0000 mg | ORAL_TABLET | Freq: Every day | ORAL | Status: DC

## 2015-04-19 NOTE — Progress Notes (Signed)
Patient reports she is doing well today. 
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
## 2015-04-20 LAB — OBSTETRIC PANEL
Antibody Screen: NEGATIVE
Basophils Absolute: 0 10*3/uL (ref 0.0–0.1)
Basophils Relative: 0 % (ref 0–1)
Eosinophils Absolute: 0.2 10*3/uL (ref 0.0–0.7)
Eosinophils Relative: 3 % (ref 0–5)
HCT: 33.1 % — ABNORMAL LOW (ref 36.0–46.0)
Hemoglobin: 11.3 g/dL — ABNORMAL LOW (ref 12.0–15.0)
Hepatitis B Surface Ag: NEGATIVE
Lymphocytes Relative: 27 % (ref 12–46)
Lymphs Abs: 2.1 10*3/uL (ref 0.7–4.0)
MCH: 32.5 pg (ref 26.0–34.0)
MCHC: 34.1 g/dL (ref 30.0–36.0)
MCV: 95.1 fL (ref 78.0–100.0)
MPV: 9.8 fL (ref 8.6–12.4)
Monocytes Absolute: 0.7 10*3/uL (ref 0.1–1.0)
Monocytes Relative: 9 % (ref 3–12)
Neutro Abs: 4.7 10*3/uL (ref 1.7–7.7)
Neutrophils Relative %: 61 % (ref 43–77)
Platelets: 216 10*3/uL (ref 150–400)
RBC: 3.48 MIL/uL — ABNORMAL LOW (ref 3.87–5.11)
RDW: 14.2 % (ref 11.5–15.5)
Rh Type: POSITIVE
Rubella: 2.39 Index — ABNORMAL HIGH (ref <0.90)
WBC: 7.7 10*3/uL (ref 4.0–10.5)

## 2015-04-20 LAB — PAP IG W/ RFLX HPV ASCU

## 2015-04-20 LAB — VARICELLA ZOSTER ANTIBODY, IGG: Varicella IgG: 1288 Index — ABNORMAL HIGH (ref <135.00)

## 2015-04-20 LAB — VITAMIN D 25 HYDROXY (VIT D DEFICIENCY, FRACTURES): Vit D, 25-Hydroxy: 15 ng/mL — ABNORMAL LOW (ref 30–100)

## 2015-04-21 LAB — HEMOGLOBINOPATHY EVALUATION
Hemoglobin Other: 0 %
Hgb A2 Quant: 3.3 % — ABNORMAL HIGH (ref 2.2–3.2)
Hgb A: 96.7 % — ABNORMAL LOW (ref 96.8–97.8)
Hgb F Quant: 0 % (ref 0.0–2.0)
Hgb S Quant: 0 %

## 2015-04-21 LAB — CULTURE, OB URINE: Colony Count: 70000

## 2015-04-22 ENCOUNTER — Other Ambulatory Visit: Payer: Medicaid Other

## 2015-04-22 ENCOUNTER — Other Ambulatory Visit: Payer: Self-pay | Admitting: Obstetrics

## 2015-04-22 DIAGNOSIS — N760 Acute vaginitis: Secondary | ICD-10-CM

## 2015-04-22 DIAGNOSIS — B9689 Other specified bacterial agents as the cause of diseases classified elsewhere: Secondary | ICD-10-CM

## 2015-04-22 DIAGNOSIS — B373 Candidiasis of vulva and vagina: Secondary | ICD-10-CM

## 2015-04-22 DIAGNOSIS — N390 Urinary tract infection, site not specified: Secondary | ICD-10-CM

## 2015-04-22 DIAGNOSIS — A5901 Trichomonal vulvovaginitis: Secondary | ICD-10-CM

## 2015-04-22 DIAGNOSIS — B3731 Acute candidiasis of vulva and vagina: Secondary | ICD-10-CM

## 2015-04-22 DIAGNOSIS — A5609 Other chlamydial infection of lower genitourinary tract: Secondary | ICD-10-CM

## 2015-04-22 LAB — SURESWAB, VAGINOSIS/VAGINITIS PLUS
Atopobium vaginae: NOT DETECTED Log (cells/mL)
C. albicans, DNA: DETECTED — AB
C. glabrata, DNA: NOT DETECTED
C. parapsilosis, DNA: NOT DETECTED
C. trachomatis RNA, TMA: DETECTED — AB
C. tropicalis, DNA: NOT DETECTED
Gardnerella vaginalis: 6.5 Log (cells/mL)
LACTOBACILLUS SPECIES: NOT DETECTED Log (cells/mL)
MEGASPHAERA SPECIES: NOT DETECTED Log (cells/mL)
N. gonorrhoeae RNA, TMA: NOT DETECTED
T. vaginalis RNA, QL TMA: DETECTED — AB

## 2015-04-22 MED ORDER — AZITHROMYCIN 250 MG PO TABS: 1000.0000 mg | ORAL_TABLET | Freq: Once | ORAL | Status: DC

## 2015-04-22 MED ORDER — TINIDAZOLE 500 MG PO TABS: 1000.0000 mg | ORAL_TABLET | Freq: Every day | ORAL | Status: DC

## 2015-04-22 MED ORDER — CEFUROXIME AXETIL 500 MG PO TABS: 500.0000 mg | ORAL_TABLET | Freq: Two times a day (BID) | ORAL | Status: DC

## 2015-04-22 MED ORDER — FLUCONAZOLE 150 MG PO TABS: 150.0000 mg | ORAL_TABLET | Freq: Once | ORAL | Status: DC

## 2015-04-22 MED ORDER — CEFIXIME 400 MG PO TABS: 400.0000 mg | ORAL_TABLET | Freq: Every day | ORAL | Status: DC

## 2015-04-29 ENCOUNTER — Other Ambulatory Visit: Payer: Self-pay | Admitting: *Deleted

## 2015-04-29 ENCOUNTER — Telehealth: Payer: Self-pay

## 2015-04-29 ENCOUNTER — Ambulatory Visit: Payer: Medicaid Other

## 2015-04-29 ENCOUNTER — Other Ambulatory Visit: Payer: Medicaid Other

## 2015-04-29 DIAGNOSIS — A5901 Trichomonal vulvovaginitis: Secondary | ICD-10-CM

## 2015-04-29 DIAGNOSIS — N760 Acute vaginitis: Secondary | ICD-10-CM

## 2015-04-29 DIAGNOSIS — B9689 Other specified bacterial agents as the cause of diseases classified elsewhere: Secondary | ICD-10-CM

## 2015-04-29 MED ORDER — METRONIDAZOLE 500 MG PO TABS: 2000.0000 mg | ORAL_TABLET | Freq: Every day | ORAL | Status: DC

## 2015-04-29 MED ORDER — TINIDAZOLE 500 MG PO TABS: 1000.0000 mg | ORAL_TABLET | Freq: Every day | ORAL | Status: DC

## 2015-04-29 NOTE — Progress Notes (Signed)
Tinidazole was changed to Metronidazole due to insurance coverage.   Metronidazole 2gm/2day was sent to pharmacy.

## 2015-04-29 NOTE — Telephone Encounter (Signed)
patient no showed, then showed late to u/s here - [facility]15[facility] - need order changed to include MFM

## 2015-05-03 ENCOUNTER — Other Ambulatory Visit: Payer: Self-pay | Admitting: Obstetrics

## 2015-05-06 ENCOUNTER — Ambulatory Visit (HOSPITAL_COMMUNITY)
Admission: RE | Admit: 2015-05-06 | Discharge: 2015-05-06 | Disposition: A | Discharge status: Home / Self Care | Payer: Medicaid Other | Source: Ambulatory Visit | Attending: Obstetrics | Admitting: Obstetrics

## 2015-05-06 DIAGNOSIS — O99332 Smoking (tobacco) complicating pregnancy, second trimester: Secondary | ICD-10-CM | POA: Diagnosis present

## 2015-05-06 DIAGNOSIS — Z36 Encounter for antenatal screening of mother: Secondary | ICD-10-CM | POA: Insufficient documentation

## 2015-05-06 DIAGNOSIS — Z3A27 27 weeks gestation of pregnancy: Secondary | ICD-10-CM | POA: Insufficient documentation

## 2015-05-06 DIAGNOSIS — O0932 Supervision of pregnancy with insufficient antenatal care, second trimester: Secondary | ICD-10-CM | POA: Diagnosis not present

## 2015-05-10 ENCOUNTER — Other Ambulatory Visit: Payer: Medicaid Other

## 2015-05-10 ENCOUNTER — Encounter: Payer: Medicaid Other | Admitting: Obstetrics

## 2015-05-17 ENCOUNTER — Other Ambulatory Visit: Payer: Self-pay | Admitting: Certified Nurse Midwife

## 2015-06-10 LAB — OB RESULTS CONSOLE GBS: GBS: NEGATIVE

## 2015-06-16 ENCOUNTER — Inpatient Hospital Stay (HOSPITAL_COMMUNITY)
Admission: AD | Admit: 2015-06-16 | Discharge: 2015-06-23 | DRG: 775 | Disposition: A | Discharge status: Home / Self Care | Payer: Medicaid Other | Source: Ambulatory Visit | Attending: Obstetrics | Admitting: Obstetrics

## 2015-06-16 ENCOUNTER — Encounter (HOSPITAL_COMMUNITY): Payer: Self-pay | Admitting: *Deleted

## 2015-06-16 DIAGNOSIS — O42919 Preterm premature rupture of membranes, unspecified as to length of time between rupture and onset of labor, unspecified trimester: Secondary | ICD-10-CM

## 2015-06-16 DIAGNOSIS — F17200 Nicotine dependence, unspecified, uncomplicated: Secondary | ICD-10-CM | POA: Diagnosis present

## 2015-06-16 DIAGNOSIS — O9902 Anemia complicating childbirth: Secondary | ICD-10-CM | POA: Diagnosis not present

## 2015-06-16 DIAGNOSIS — O42913 Preterm premature rupture of membranes, unspecified as to length of time between rupture and onset of labor, third trimester: Secondary | ICD-10-CM | POA: Diagnosis present

## 2015-06-16 DIAGNOSIS — Z818 Family history of other mental and behavioral disorders: Secondary | ICD-10-CM

## 2015-06-16 DIAGNOSIS — O98819 Other maternal infectious and parasitic diseases complicating pregnancy, unspecified trimester: Secondary | ICD-10-CM

## 2015-06-16 DIAGNOSIS — Z3490 Encounter for supervision of normal pregnancy, unspecified, unspecified trimester: Secondary | ICD-10-CM

## 2015-06-16 DIAGNOSIS — Z8249 Family history of ischemic heart disease and other diseases of the circulatory system: Secondary | ICD-10-CM | POA: Diagnosis not present

## 2015-06-16 DIAGNOSIS — Z3A34 34 weeks gestation of pregnancy: Secondary | ICD-10-CM | POA: Diagnosis not present

## 2015-06-16 DIAGNOSIS — Z91013 Allergy to seafood: Secondary | ICD-10-CM

## 2015-06-16 DIAGNOSIS — O4290 Premature rupture of membranes, unspecified as to length of time between rupture and onset of labor, unspecified weeks of gestation: Secondary | ICD-10-CM

## 2015-06-16 DIAGNOSIS — Z3689 Encounter for other specified antenatal screening: Secondary | ICD-10-CM

## 2015-06-16 DIAGNOSIS — D649 Anemia, unspecified: Secondary | ICD-10-CM | POA: Diagnosis not present

## 2015-06-16 DIAGNOSIS — A749 Chlamydial infection, unspecified: Secondary | ICD-10-CM

## 2015-06-16 DIAGNOSIS — O99334 Smoking (tobacco) complicating childbirth: Secondary | ICD-10-CM | POA: Diagnosis present

## 2015-06-16 LAB — CBC
HCT: 29 % — ABNORMAL LOW (ref 36.0–46.0)
Hemoglobin: 9.7 g/dL — ABNORMAL LOW (ref 12.0–15.0)
MCH: 30.2 pg (ref 26.0–34.0)
MCHC: 33.4 g/dL (ref 30.0–36.0)
MCV: 90.3 fL (ref 78.0–100.0)
Platelets: 228 10*3/uL (ref 150–400)
RBC: 3.21 MIL/uL — ABNORMAL LOW (ref 3.87–5.11)
RDW: 14.5 % (ref 11.5–15.5)
WBC: 11.4 10*3/uL — ABNORMAL HIGH (ref 4.0–10.5)

## 2015-06-16 LAB — TYPE AND SCREEN
ABO/RH(D): A POS
Antibody Screen: NEGATIVE

## 2015-06-16 LAB — AMNISURE RUPTURE OF MEMBRANE (ROM) NOT AT ARMC: Amnisure ROM: POSITIVE

## 2015-06-16 MED ORDER — LACTATED RINGERS IV SOLN
INTRAVENOUS | Status: DC
  Administered 2015-06-16 – 2015-06-18 (×5): via INTRAVENOUS

## 2015-06-16 MED ORDER — SODIUM CHLORIDE 0.9 % IV SOLN
2.0000 g | Freq: Four times a day (QID) | INTRAVENOUS | Status: AC
  Administered 2015-06-16 – 2015-06-18 (×8): 2 g via INTRAVENOUS
  Filled 2015-06-16 (×8): qty 2000

## 2015-06-16 MED ORDER — ACETAMINOPHEN 325 MG PO TABS
650.0000 mg | ORAL_TABLET | ORAL | Status: DC | PRN
  Administered 2015-06-18: 650 mg via ORAL
  Filled 2015-06-16: qty 2

## 2015-06-16 MED ORDER — SODIUM CHLORIDE 0.9 % IV SOLN
250.0000 mg | Freq: Four times a day (QID) | INTRAVENOUS | Status: AC
  Administered 2015-06-17 – 2015-06-18 (×8): 250 mg via INTRAVENOUS
  Filled 2015-06-16 (×8): qty 5

## 2015-06-16 MED ORDER — BETAMETHASONE SOD PHOS & ACET 6 (3-3) MG/ML IJ SUSP
12.0000 mg | INTRAMUSCULAR | Status: AC
  Administered 2015-06-16 – 2015-06-17 (×2): 12 mg via INTRAMUSCULAR
  Filled 2015-06-16 (×2): qty 2

## 2015-06-16 MED ORDER — CALCIUM CARBONATE ANTACID 500 MG PO CHEW
2.0000 | CHEWABLE_TABLET | ORAL | Status: DC | PRN
  Administered 2015-06-17: 400 mg via ORAL
  Filled 2015-06-16: qty 2

## 2015-06-16 MED ORDER — DOCUSATE SODIUM 100 MG PO CAPS
100.0000 mg | ORAL_CAPSULE | Freq: Every day | ORAL | Status: DC
  Administered 2015-06-17 – 2015-06-20 (×4): 100 mg via ORAL
  Filled 2015-06-16 (×4): qty 1

## 2015-06-16 MED ORDER — PRENATAL MULTIVITAMIN CH
1.0000 | ORAL_TABLET | Freq: Every day | ORAL | Status: DC
  Administered 2015-06-17 – 2015-06-20 (×4): 1 via ORAL
  Filled 2015-06-16 (×4): qty 1

## 2015-06-16 MED ORDER — AMOXICILLIN 500 MG PO CAPS
500.0000 mg | ORAL_CAPSULE | Freq: Three times a day (TID) | ORAL | Status: DC
  Administered 2015-06-18 – 2015-06-20 (×5): 500 mg via ORAL
  Filled 2015-06-16 (×9): qty 1

## 2015-06-16 MED ORDER — SODIUM CHLORIDE 0.9 % IV SOLN
500.0000 mg | Freq: Once | INTRAVENOUS | Status: AC
  Administered 2015-06-16: 500 mg via INTRAVENOUS
  Filled 2015-06-16: qty 10

## 2015-06-16 MED ORDER — SERTRALINE HCL 25 MG PO TABS
25.0000 mg | ORAL_TABLET | Freq: Every day | ORAL | Status: DC
  Filled 2015-06-16 (×5): qty 1

## 2015-06-16 MED ORDER — ERYTHROMYCIN BASE 250 MG PO TBEC
250.0000 mg | DELAYED_RELEASE_TABLET | Freq: Four times a day (QID) | ORAL | Status: DC
  Administered 2015-06-19 – 2015-06-20 (×7): 250 mg via ORAL
  Filled 2015-06-16 (×13): qty 1

## 2015-06-16 MED ORDER — ZOLPIDEM TARTRATE 5 MG PO TABS
5.0000 mg | ORAL_TABLET | Freq: Every evening | ORAL | Status: DC | PRN
  Administered 2015-06-16: 5 mg via ORAL
  Filled 2015-06-16: qty 1

## 2015-06-16 NOTE — Consult Note (Signed)
Asked by Dr.Harper to provide prenatal consultation for patient at risk for preterm delivery due to PROM.  Mother is 89 y.o. G4 P2 who is now 33.[redacted] weeks EGA, with pregnancy uncomplicated until she had gush of fluid today followed by ongoing leakage.  No labor, fever or fetal distress.  She is being treated with betamethasone (first dose tonight 6pm) and antibiotics (ampicillin currently, plan for amoxicillin on 4/28).  Plan for induction at 34 wks.  Talked with patient along with her mother and father of baby.  Discussed usual expectations for preterm infant at 7533 - [redacted]  weeks gestation, including possible need for respiratory support, IV access, incubator, tube feedings.  Projected possible length of stay in NICU until 36 - [redacted] wks EGA.  Discussed advantages of feeding with mother's milk.  She plans to pump postnatally. Also encouraged family participation in care with skin-to-skin, etc.  Patient was attentive, had appropriate questions, and was appreciative of my input.  Thank you for consulting Neonatology  Total time 30 minutes  Talitha Dicarlo E. Barrie DunkerWimmer, Jr., MD Neonatologist

## 2015-06-16 NOTE — MAU Note (Signed)
Pt states she had a gush of fluid come put about 30 min ago. Stated she feels like she is still leaking now. C/O some on and off cramping since the leakage. +FM reported

## 2015-06-16 NOTE — MAU Note (Signed)
Herbert SetaHeather, Consulting civil engineerCharge RN called and given report. Pt. To be transferred to room 160 for admission. Pt. And family agreeable of plan of care.
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
## 2015-06-17 ENCOUNTER — Inpatient Hospital Stay (HOSPITAL_COMMUNITY): Payer: Medicaid Other

## 2015-06-17 DIAGNOSIS — O42919 Preterm premature rupture of membranes, unspecified as to length of time between rupture and onset of labor, unspecified trimester: Secondary | ICD-10-CM | POA: Diagnosis present

## 2015-06-17 DIAGNOSIS — O4290 Premature rupture of membranes, unspecified as to length of time between rupture and onset of labor, unspecified weeks of gestation: Secondary | ICD-10-CM
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
## 2015-06-17 NOTE — Progress Notes (Signed)
Sheria Langlexandra Brener is a 25 y.o. [phone] at 6445w4d by ultrasound admitted for PROM  Subjective:   Objective: BP 90/46 mmHg  Pulse 91  Temp(Src) 98.1 F (36.7 C) (Oral)  Resp 18  Ht 5\' 8"  (1.727 m)  Wt 198 lb 3.2 oz (89.903 kg)  BMI 30.14 kg/m2  LMP 10/25/2014      FHT:  FHR: 125 bpm, variability: moderate,  accelerations:  Present,  decelerations:  Absent UC:   occasional SVE:      Labs: Lab Results  Component Value Date   WBC 11.4* 06/16/2015   HGB 9.7* 06/16/2015   HCT 29.0* 06/16/2015   MCV 90.3 06/16/2015   PLT 228 06/16/2015    Assessment / Plan: PPROM, stable.  BMZ started 06/16/15.  Antibiotics & IVF as ordered.    Fetal Wellbeing:  Category I Pain Control:  no pain currently Anticipated MOD:  NSVD and at 34 weeks  Roe Coombsachelle A Samar Dass, CNM 06/17/2015, 8:50 AM

## 2015-06-18 LAB — CULTURE, BETA STREP (GROUP B ONLY)

## 2015-06-18 NOTE — Progress Notes (Signed)
Patient ID: Amber Griffin, female   DOB: 1990-12-14, 25 y.o.   MRN: [id number]  Hospital Day: 3  S: Preterm labor symptoms: occasional contractions.   O: Blood pressure 95/60, pulse 87, temperature 97.6 F (36.4 C), temperature source Oral, resp. rate 18, height 5\' 8"  (1.727 m), weight 198 lb 3.2 oz (89.903 kg), last menstrual period 10/25/2014.   WUJ:WJXBJYNWFHT:Baseline: 130 bpm, Variability: Good {> 6 bpm), Accelerations: Reactive and Decelerations: Absent Toco: irregular SVE:   A/P- 25 y.o. admitted with:  PPROM @[redacted]w[redacted]d   Present on Admission:  . Preterm premature rupture of membranes (PPROM) with unknown onset of labor  Pregnancy Complications: PPROM  Preterm labor management: bedrest advised, pelvic rest advised and BMZ, Mag bolus, Antibiotics Dating:  7129w5d PNL Needed:  none FWB:  good PTL:  Stable, IOL scheduled for Sunday

## 2015-06-19 NOTE — Progress Notes (Addendum)
Patient ID: Amber Griffin, female   DOB: 12-18-1990, 25 y.o.   MRN: [id number] Hospital Day: 4  S: Preterm labor symptoms: fluid leakage  O: Blood pressure 102/66, pulse 87, temperature 98.7 F (37.1 C), temperature source Oral, resp. rate 17, height 5\' 8"  (1.727 m), weight 198 lb 3.2 oz (89.903 kg), last menstrual period 10/25/2014.   WUJ:WJXBJYNWFHT:Baseline: 150 bpm Toco: Occasional mild UC SVE:   A/P- 25 y.o. admitted with: PROM at 33 weeks.  On bedrest with plan for delivery at 34 weeks.   Present on Admission:  . Preterm premature rupture of membranes (PPROM) with unknown onset of labor  Pregnancy Complications: PROM  Preterm labor management: no treatment necessary Dating:  2649w6d PNL Needed:  none FWB:  good PTL:  stable ROD: induced vaginal

## 2015-06-20 ENCOUNTER — Encounter (HOSPITAL_COMMUNITY): Payer: Self-pay | Admitting: *Deleted

## 2015-06-20 ENCOUNTER — Inpatient Hospital Stay (HOSPITAL_COMMUNITY): Payer: Medicaid Other

## 2015-06-20 LAB — CBC
HCT: 28.4 % — ABNORMAL LOW (ref 36.0–46.0)
Hemoglobin: 9.4 g/dL — ABNORMAL LOW (ref 12.0–15.0)
MCH: 30 pg (ref 26.0–34.0)
MCHC: 33.1 g/dL (ref 30.0–36.0)
MCV: 90.7 fL (ref 78.0–100.0)
Platelets: 241 10*3/uL (ref 150–400)
RBC: 3.13 MIL/uL — ABNORMAL LOW (ref 3.87–5.11)
RDW: 15 % (ref 11.5–15.5)
WBC: 21.5 10*3/uL — ABNORMAL HIGH (ref 4.0–10.5)

## 2015-06-20 LAB — TYPE AND SCREEN
ABO/RH(D): A POS
Antibody Screen: NEGATIVE

## 2015-06-20 MED ORDER — ONDANSETRON HCL 4 MG/2ML IJ SOLN
4.0000 mg | Freq: Four times a day (QID) | INTRAMUSCULAR | Status: DC | PRN
  Administered 2015-06-21: 4 mg via INTRAVENOUS
  Filled 2015-06-20: qty 2

## 2015-06-20 MED ORDER — OXYTOCIN 10 UNIT/ML IJ SOLN
1.0000 m[IU]/min | INTRAMUSCULAR | Status: DC
  Filled 2015-06-20 (×2): qty 4

## 2015-06-20 MED ORDER — MISOPROSTOL 50MCG HALF TABLET
50.0000 ug | ORAL_TABLET | Freq: Once | ORAL | Status: AC
  Administered 2015-06-20: 50 ug via ORAL
  Filled 2015-06-20: qty 0.5

## 2015-06-20 MED ORDER — LACTATED RINGERS IV SOLN
500.0000 mL | INTRAVENOUS | Status: DC | PRN
  Administered 2015-06-21: 500 mL via INTRAVENOUS

## 2015-06-20 MED ORDER — OXYTOCIN 10 UNIT/ML IJ SOLN
1.0000 m[IU]/min | INTRAVENOUS | Status: DC
  Administered 2015-06-21: 2 m[IU]/min via INTRAVENOUS

## 2015-06-20 MED ORDER — FLEET ENEMA 7-19 GM/118ML RE ENEM: 1.0000 | ENEMA | RECTAL | Status: DC | PRN

## 2015-06-20 MED ORDER — PROCHLORPERAZINE EDISYLATE 5 MG/ML IJ SOLN
10.0000 mg | Freq: Four times a day (QID) | INTRAMUSCULAR | Status: DC | PRN
  Filled 2015-06-20: qty 2

## 2015-06-20 MED ORDER — TERBUTALINE SULFATE 1 MG/ML IJ SOLN: 0.2500 mg | Freq: Once | INTRAMUSCULAR | Status: DC | PRN

## 2015-06-20 MED ORDER — MISOPROSTOL 200 MCG PO TABS
50.0000 ug | ORAL_TABLET | ORAL | Status: DC | PRN
  Administered 2015-06-20 (×2): 50 ug via ORAL
  Filled 2015-06-20 (×2): qty 0.5

## 2015-06-20 MED ORDER — OXYTOCIN BOLUS FROM INFUSION
500.0000 mL | INTRAVENOUS | Status: DC
  Administered 2015-06-21: 500 mL via INTRAVENOUS

## 2015-06-20 MED ORDER — LACTATED RINGERS IV SOLN
INTRAVENOUS | Status: DC
  Administered 2015-06-21: 04:00:00 via INTRAVENOUS

## 2015-06-20 MED ORDER — OXYCODONE-ACETAMINOPHEN 5-325 MG PO TABS: 1.0000 | ORAL_TABLET | ORAL | Status: DC | PRN

## 2015-06-20 MED ORDER — NALBUPHINE HCL 10 MG/ML IJ SOLN: 10.0000 mg | Freq: Four times a day (QID) | INTRAMUSCULAR | Status: DC | PRN

## 2015-06-20 MED ORDER — OXYCODONE-ACETAMINOPHEN 5-325 MG PO TABS: 2.0000 | ORAL_TABLET | ORAL | Status: DC | PRN

## 2015-06-20 MED ORDER — LIDOCAINE HCL (PF) 1 % IJ SOLN
30.0000 mL | INTRAMUSCULAR | Status: DC | PRN
  Filled 2015-06-20: qty 30

## 2015-06-20 MED ORDER — NALBUPHINE HCL 10 MG/ML IJ SOLN
10.0000 mg | INTRAMUSCULAR | Status: DC | PRN
Start: 2015-06-20 — End: 2015-06-21
  Administered 2015-06-21: 10 mg via INTRAVENOUS
  Filled 2015-06-20: qty 1

## 2015-06-20 MED ORDER — ACETAMINOPHEN 325 MG PO TABS: 650.0000 mg | ORAL_TABLET | ORAL | Status: DC | PRN

## 2015-06-20 MED ORDER — OXYTOCIN 10 UNIT/ML IJ SOLN: 2.5000 [IU]/h | INTRAVENOUS | Status: DC

## 2015-06-20 MED ORDER — CITRIC ACID-SODIUM CITRATE 334-500 MG/5ML PO SOLN
30.0000 mL | ORAL | Status: DC | PRN
  Administered 2015-06-20: 30 mL via ORAL
  Filled 2015-06-20: qty 15

## 2015-06-20 NOTE — Progress Notes (Signed)
Pt stated she and support person have additional questions about probable NICU admission. Dr. Eric FormWimmer notified and will come for additional NICU consult.

## 2015-06-20 NOTE — Progress Notes (Signed)
Patient ID: Amber Griffin, female   DOB: 04-Jun-1990, 25 y.o.   MRN: [id number] Hospital Day: 5  S: No complaints.  O: Blood pressure 93/46, pulse 102, temperature 97 F (36.1 C), temperature source Axillary, resp. rate 18, height 5\' 8"  (1.727 m), weight 198 lb 3.2 oz (89.903 kg), last menstrual period 10/25/2014.   NWG:NFAOZHYQFHT:Baseline: 140 bpm Toco: Occasional mild UC SVE:   A/P- 25 y.o. admitted with:  PPROM at 33 weeks.  Responded well to bedrest.  For IOL today at 34 weeks.    Present on Admission:  . Preterm premature rupture of membranes (PPROM) with unknown onset of labor  Pregnancy Complications: PPROM  Preterm labor management: none Dating:  2450w0d PNL Needed:  none FWB:  good PTL:  stable ROD: spontaneous vaginal

## 2015-06-20 NOTE — Anesthesia Pain Management Evaluation Note (Signed)
  CRNA Pain Management Visit Note  Patient: Amber Griffin, 25 y.o., female  "Hello I am a member of the anesthesia team at [facility]. We have an anesthesia team available at all times to provide care throughout the hospital, including epidural management and anesthesia for C-section. I don't know your plan for the delivery whether it a natural birth, water birth, IV sedation, nitrous supplementation, doula or epidural, but we want to meet your pain goals."   1.Was your pain managed to your expectations on prior hospitalizations? Yes  2.What is your expectation for pain management during this hospitalization?    Epidural  3.How can we help you reach that goal? Epidural  Record the patient's initial score and the patient's pain goal.   Pain: 2  Pain Goal: 6  [facility] wants you to be able to say your pain was always managed very well.  [facility] CenterWRINKLE,Amber Griffin 06/20/2015

## 2015-06-21 ENCOUNTER — Inpatient Hospital Stay (HOSPITAL_COMMUNITY): Payer: Medicaid Other | Admitting: Anesthesiology

## 2015-06-21 ENCOUNTER — Encounter (HOSPITAL_COMMUNITY): Payer: Self-pay | Admitting: Anesthesiology

## 2015-06-21 LAB — RPR: RPR Ser Ql: NONREACTIVE

## 2015-06-21 MED ORDER — LIDOCAINE HCL (PF) 1 % IJ SOLN
INTRAMUSCULAR | Status: DC | PRN
  Administered 2015-06-21 (×2): 9 mL via EPIDURAL

## 2015-06-21 MED ORDER — OXYCODONE-ACETAMINOPHEN 5-325 MG PO TABS
1.0000 | ORAL_TABLET | ORAL | Status: DC | PRN
  Administered 2015-06-22: 1 via ORAL
  Filled 2015-06-21: qty 1

## 2015-06-21 MED ORDER — FENTANYL 2.5 MCG/ML BUPIVACAINE 1/10 % EPIDURAL INFUSION (WH - ANES)
14.0000 mL/h | INTRAMUSCULAR | Status: DC | PRN
  Administered 2015-06-21 (×2): 14 mL/h via EPIDURAL

## 2015-06-21 MED ORDER — COCONUT OIL OIL: 1.0000 "application " | TOPICAL_OIL | Status: DC | PRN

## 2015-06-21 MED ORDER — ONDANSETRON HCL 4 MG/2ML IJ SOLN: 4.0000 mg | INTRAMUSCULAR | Status: DC | PRN

## 2015-06-21 MED ORDER — DIPHENHYDRAMINE HCL 50 MG/ML IJ SOLN: 12.5000 mg | INTRAMUSCULAR | Status: DC | PRN

## 2015-06-21 MED ORDER — FENTANYL 2.5 MCG/ML BUPIVACAINE 1/10 % EPIDURAL INFUSION (WH - ANES)
INTRAMUSCULAR | Status: AC
  Filled 2015-06-21: qty 125

## 2015-06-21 MED ORDER — DIBUCAINE 1 % RE OINT: 1.0000 "application " | TOPICAL_OINTMENT | RECTAL | Status: DC | PRN

## 2015-06-21 MED ORDER — OXYTOCIN 10 UNIT/ML IJ SOLN: 2.5000 [IU]/h | INTRAVENOUS | Status: DC | PRN

## 2015-06-21 MED ORDER — PRENATAL MULTIVITAMIN CH
1.0000 | ORAL_TABLET | Freq: Every day | ORAL | Status: DC
  Administered 2015-06-21 – 2015-06-22 (×2): 1 via ORAL
  Filled 2015-06-21 (×2): qty 1

## 2015-06-21 MED ORDER — IBUPROFEN 600 MG PO TABS
600.0000 mg | ORAL_TABLET | Freq: Four times a day (QID) | ORAL | Status: DC
  Administered 2015-06-21 – 2015-06-22 (×7): 600 mg via ORAL
  Filled 2015-06-21 (×8): qty 1

## 2015-06-21 MED ORDER — SIMETHICONE 80 MG PO CHEW
80.0000 mg | CHEWABLE_TABLET | ORAL | Status: DC | PRN
  Administered 2015-06-21: 80 mg via ORAL

## 2015-06-21 MED ORDER — BENZOCAINE-MENTHOL 20-0.5 % EX AERO: 1.0000 "application " | INHALATION_SPRAY | CUTANEOUS | Status: DC | PRN

## 2015-06-21 MED ORDER — SENNOSIDES-DOCUSATE SODIUM 8.6-50 MG PO TABS
2.0000 | ORAL_TABLET | ORAL | Status: DC
  Administered 2015-06-21: 2 via ORAL
  Filled 2015-06-21: qty 2

## 2015-06-21 MED ORDER — OXYCODONE-ACETAMINOPHEN 5-325 MG PO TABS
2.0000 | ORAL_TABLET | ORAL | Status: DC | PRN
  Administered 2015-06-21: 2 via ORAL
  Filled 2015-06-21: qty 2

## 2015-06-21 MED ORDER — WITCH HAZEL-GLYCERIN EX PADS: 1.0000 "application " | MEDICATED_PAD | CUTANEOUS | Status: DC | PRN

## 2015-06-21 MED ORDER — EPHEDRINE 5 MG/ML INJ
INTRAVENOUS | Status: AC
  Filled 2015-06-21: qty 4

## 2015-06-21 MED ORDER — DIPHENHYDRAMINE HCL 25 MG PO CAPS
25.0000 mg | ORAL_CAPSULE | Freq: Four times a day (QID) | ORAL | Status: DC | PRN
Start: 2015-06-21 — End: 2015-06-23

## 2015-06-21 MED ORDER — PHENYLEPHRINE 40 MCG/ML (10ML) SYRINGE FOR IV PUSH (FOR BLOOD PRESSURE SUPPORT)
PREFILLED_SYRINGE | INTRAVENOUS | Status: AC
  Filled 2015-06-21: qty 20

## 2015-06-21 MED ORDER — EPHEDRINE 5 MG/ML INJ
10.0000 mg | INTRAVENOUS | Status: DC | PRN
  Administered 2015-06-21: 10 mg via INTRAVENOUS
  Filled 2015-06-21: qty 2
  Filled 2015-06-21: qty 4

## 2015-06-21 MED ORDER — EPHEDRINE 5 MG/ML INJ
10.0000 mg | INTRAVENOUS | Status: DC | PRN
  Administered 2015-06-21: 10 mg via INTRAVENOUS
  Filled 2015-06-21 (×3): qty 4
  Filled 2015-06-21: qty 2

## 2015-06-21 MED ORDER — PHENYLEPHRINE 40 MCG/ML (10ML) SYRINGE FOR IV PUSH (FOR BLOOD PRESSURE SUPPORT)
80.0000 ug | PREFILLED_SYRINGE | INTRAVENOUS | Status: DC | PRN
  Administered 2015-06-21: 80 ug via INTRAVENOUS
  Filled 2015-06-21: qty 10
  Filled 2015-06-21: qty 5

## 2015-06-21 MED ORDER — LACTATED RINGERS IV SOLN
500.0000 mL | Freq: Once | INTRAVENOUS | Status: AC
  Administered 2015-06-21: 500 mL via INTRAVENOUS

## 2015-06-21 MED ORDER — ACETAMINOPHEN 325 MG PO TABS
650.0000 mg | ORAL_TABLET | ORAL | Status: DC | PRN
  Administered 2015-06-21: 650 mg via ORAL
  Filled 2015-06-21: qty 2

## 2015-06-21 MED ORDER — ZOLPIDEM TARTRATE 5 MG PO TABS
5.0000 mg | ORAL_TABLET | Freq: Every evening | ORAL | Status: DC | PRN
  Administered 2015-06-21 – 2015-06-22 (×2): 5 mg via ORAL
  Filled 2015-06-21 (×2): qty 1

## 2015-06-21 MED ORDER — EPHEDRINE 5 MG/ML INJ
INTRAVENOUS | Status: AC
  Administered 2015-06-21: 10 mg
  Filled 2015-06-21: qty 4

## 2015-06-21 MED ORDER — MEDROXYPROGESTERONE ACETATE 150 MG/ML IM SUSP: 150.0000 mg | INTRAMUSCULAR | Status: DC | PRN

## 2015-06-21 MED ORDER — ONDANSETRON HCL 4 MG PO TABS: 4.0000 mg | ORAL_TABLET | ORAL | Status: DC | PRN

## 2015-06-21 MED ORDER — LACTATED RINGERS IV BOLUS (SEPSIS)
500.0000 mL | Freq: Once | INTRAVENOUS | Status: AC
  Administered 2015-06-21: 500 mL via INTRAVENOUS

## 2015-06-21 MED ORDER — TETANUS-DIPHTH-ACELL PERTUSSIS 5-2.5-18.5 LF-MCG/0.5 IM SUSP
0.5000 mL | Freq: Once | INTRAMUSCULAR | Status: DC
  Filled 2015-06-21: qty 0.5

## 2015-06-21 MED ORDER — PHENYLEPHRINE 40 MCG/ML (10ML) SYRINGE FOR IV PUSH (FOR BLOOD PRESSURE SUPPORT)
80.0000 ug | PREFILLED_SYRINGE | INTRAVENOUS | Status: DC | PRN
Start: 2015-06-21 — End: 2015-06-21
  Administered 2015-06-21: 80 ug via INTRAVENOUS
  Filled 2015-06-21: qty 5

## 2015-06-21 NOTE — Consult Note (Signed)
Neonatology Note:  Attendance at Code Apgar:  Our team responded to a Code Apgar call to room # 174 following NSVD, due to precipitous delivery of this preterm infant at 34 1/[redacted] weeks GA. The requesting practioner was Marie Lawson, CNM, for Dr. Harper. The mother is a G4P2A1 A pos, GBS neg with a history of migraines and PPROM since 4/26. The amniotic fluid was clear. The mother was treated with IV Ampicillin and Erythromycin, changing to PO Amoxicillin and Erythromycin, since 4/26. She remained afebrile throughout labor. She also got 2 doses of Betamethasone on 4/26-27. Labor was induced yesterday with Cytotec and Pitocin. She got a dose of Nubain about 5 hours prior to delivery and required Ephedrine twice in the past 4 hours due to hypotension. At delivery, the baby cried spontaneously and had good tone. The OB nursing staff in attendance gave stimulation and placed the infant on the mother's abdomen. Our team arrived at 1.5 minutes of life, at which time the baby was crying and doing well. I examined the baby, who was pink, well-perfused, and in no respiratory distress. Ap 8/9. I spoke with the parents in the DR, the mother held the baby briefly, then we transported the baby to the NICU for further care, with her father in attendance.  Metzli Pollick C. Leeasia Secrist, MD 

## 2015-06-21 NOTE — Lactation Note (Signed)
This note was copied from a baby's chart. Lactation Consultation Note  Patient Name: Amber Griffin ZOXWR'UToday's Date: 06/21/2015 Reason for consult: Initial assessment;NICU baby  NICU baby 10 hours old. Mom has been visiting baby in NICU often. Mom reports that she has been pumping and hand expressing and is seeing colostrum. Enc mom to continue to pump at least 8 times/24 hours followed by hand expression. Mom given NICU booklet and LC brochure with review. Enc mom to call for assistance as needed.  Maternal Data    Feeding Feeding Type: Breast Milk Length of feed: 30 min  LATCH Score/Interventions                      Lactation Tools Discussed/Used Pump Review: Setup, frequency, and cleaning;Milk Storage Initiated by:: JW Date initiated:: 06/21/15   Consult Status Consult Status: Follow-up Date: 06/22/15 Follow-up type: In-patient    Geralynn OchsWILLIARD, Gurvir Schrom 06/21/2015, 3:55 PM

## 2015-06-21 NOTE — Anesthesia Procedure Notes (Signed)
Epidural Patient location during procedure: OB Start time: 06/21/2015 2:07 AM End time: 06/21/2015 2:11 AM  Staffing Anesthesiologist: Leilani AbleHATCHETT, Amaryllis Malmquist Performed by: anesthesiologist   Preanesthetic Checklist Completed: patient identified, surgical consent, pre-op evaluation, timeout performed, IV checked, risks and benefits discussed and monitors and equipment checked  Epidural Patient position: sitting Prep: site prepped and draped and DuraPrep Patient monitoring: continuous pulse ox and blood pressure Approach: midline Location: L3-L4 Injection technique: LOR air  Needle:  Needle type: Tuohy  Needle gauge: 17 G Needle length: 9 cm and 9 Needle insertion depth: 6 cm Catheter type: closed end flexible Catheter size: 19 Gauge Catheter at skin depth: 11 cm Test dose: negative and Other  Assessment Sensory level: T9 Events: blood not aspirated, injection not painful, no injection resistance, negative IV test and no paresthesia  Additional Notes Reason for block:procedure for pain

## 2015-06-21 NOTE — Anesthesia Postprocedure Evaluation (Signed)
Anesthesia Post Note  Patient: Amber Griffin  Procedure(s) Performed: * No procedures listed *  Patient location during evaluation: [facility] Unit Anesthesia Type: Epidural Level of consciousness: awake and alert and oriented Pain management: pain level controlled Vital Signs Assessment: post-procedure vital signs reviewed and stable Respiratory status: spontaneous breathing and nonlabored ventilation Cardiovascular status: stable Postop Assessment: patient able to bend at knees, no signs of nausea or vomiting and adequate PO intake Anesthetic complications: no     Last Vitals:  Filed Vitals:   06/21/15 0807 06/21/15 0910  BP: 114/67 89/34  Pulse: 106 102  Temp: 36.8 C 36.8 C  Resp: 16 16    Last Pain:  Filed Vitals:   06/21/15 1121  PainSc: 6    Pain Goal: Patients Stated Pain Goal: 3 (06/21/15 1120)               [name]

## 2015-06-21 NOTE — Progress Notes (Signed)
Per Dr Arby BarretteHatchett, patient's baseline BP runs on the soft side. Okay to restart epidural and continue to monitor patient. Stop and notify MD if patient and/or baby become symptomatic.

## 2015-06-21 NOTE — Progress Notes (Signed)
Patient requesting epidural. BP soft with SBP 80-90's, patient asymptomatic. Dr Arby BarretteHatchett notified and received verbal order to give an additional 500cc bolus in addition to the ordered 500.

## 2015-06-21 NOTE — Anesthesia Preprocedure Evaluation (Signed)
Anesthesia Evaluation  Patient identified by MRN, date of birth, ID band Patient awake    Reviewed: Allergy & Precautions, H&P , NPO status , Patient's Chart, lab work & pertinent test results  Airway Mallampati: II  TM Distance: >3 FB Neck ROM: full    Dental no notable dental hx.    Pulmonary Current Smoker,    Pulmonary exam normal        Cardiovascular negative cardio ROS Normal cardiovascular exam     Neuro/Psych negative psych ROS   GI/Hepatic negative GI ROS, Neg liver ROS,   Endo/Other  negative endocrine ROS  Renal/GU negative Renal ROS     Musculoskeletal   Abdominal (+) + obese,   Peds  Hematology negative hematology ROS (+)   Anesthesia Other Findings   Reproductive/Obstetrics (+) Pregnancy                             Anesthesia Physical Anesthesia Plan  ASA: II  Anesthesia Plan: Epidural   Post-op Pain Management:    Induction:   Airway Management Planned:   Additional Equipment:   Intra-op Plan:   Post-operative Plan:   Informed Consent: I have reviewed the patients History and Physical, chart, labs and discussed the procedure including the risks, benefits and alternatives for the proposed anesthesia with the patient or authorized representative who has indicated his/her understanding and acceptance.     Plan Discussed with:   Anesthesia Plan Comments:         Anesthesia Quick Evaluation

## 2015-06-22 LAB — CBC
HCT: 31.5 % — ABNORMAL LOW (ref 36.0–46.0)
Hemoglobin: 10.4 g/dL — ABNORMAL LOW (ref 12.0–15.0)
MCH: 30.3 pg (ref 26.0–34.0)
MCHC: 33 g/dL (ref 30.0–36.0)
MCV: 91.8 fL (ref 78.0–100.0)
Platelets: 272 10*3/uL (ref 150–400)
RBC: 3.43 MIL/uL — ABNORMAL LOW (ref 3.87–5.11)
RDW: 15.2 % (ref 11.5–15.5)
WBC: 23.3 10*3/uL — ABNORMAL HIGH (ref 4.0–10.5)

## 2015-06-22 MED ORDER — FERROUS SULFATE 325 (65 FE) MG PO TABS
325.0000 mg | ORAL_TABLET | Freq: Three times a day (TID) | ORAL | Status: DC
  Administered 2015-06-22 – 2015-06-23 (×3): 325 mg via ORAL
  Filled 2015-06-22 (×4): qty 1

## 2015-06-22 NOTE — Lactation Note (Signed)
This note was copied from a baby's chart. Lactation Consultation Note  Patient Name: Amber Sheria Langlexandra Fritts WUJWJ'XToday's Date: 06/22/2015 Reason for consult: Follow-up assessment;NICU baby;Late preterm infant;Infant < 6lbs   Follow up with mom of 29 hour old NICU infant. Mom reports [name] is doing well. Mom reports she is pumping up to 40 cc every "couple of hours". Mom reports DEBP is painful, she reports she is turning the suction on high to start, enc her to start on low and then increase as she can tolerate. Enc her tp continue pumping every 2-3 hours for 15 minutes, advised her to change to maintenance setting on pump.   Mom denies questions at this time. She reports infant is being fed via NG. Mom reports she is getting an evenflo pump for a shower present. Discussed that the Evenflo may not be strong enough since she has a preterm infant and recommended she consider a DEBP. Did discuss there is a DEBP in NICU she can use when visiting. [facility], mom reports [facility].   Follow up tomorrow and prn.   Maternal Data Formula Feeding for Exclusion: No Has patient been taught Hand Expression?: Yes  Feeding Feeding Type: Formula Length of feed: 30 min  LATCH Score/Interventions                      Lactation Tools Discussed/Used WIC Program: Yes [facility]([place] [facility]) Pump Review: Setup, frequency, and cleaning;Milk Storage   Consult Status Consult Status: Follow-up Date: 06/23/15 Follow-up type: In-patient    Amber Griffin 06/22/2015, 10:33 AM

## 2015-06-22 NOTE — Progress Notes (Signed)
Spoke with Amber Griffin, CNM about pt thinking she was and hoping to go home this afternoon. After informing CNM of episode of anxiety this am. She states that she prefers for CSW to see pt before she is d/c. Pt was made aware and agrees to stay tonight. Amber Griffin, Amber Griffin

## 2015-06-22 NOTE — Progress Notes (Signed)
While coming down the hallway to give report this am, me, Bobbye MortonNadine Acheampong, RN, and Domenick GongZelvia Franks, RN heard a woman screaming for help. We ran down the hallway and found her in her room standing at the window crying. Her SO was on the other side of the bed packing up his things. He was very calm but stated that she was "really emotional right now" and that everything he said or did upset her. Pts 25 yo daughter was sitting in chair at this time crying. I went to the child and held her and distracted her while the other nurses spoke to the mother. Pt was crying saying that she has a small baby that is in the NICU and feels like it's her fault because she smoked tobacco during her pregnancy and couldn't stop even though she tried. Pt was calmed down and said she was going to pump at this time. SO left the bedside for a while. 25 year old daughter was given paper and colored pens to draw which calmed her as well. Pt and daughter left shortly thereafter to go visit baby in the NICU. Pts SO returned to bedside and family seems much better. Sheryn BisonGordon, Rahim Astorga Warner, RN

## 2015-06-22 NOTE — Progress Notes (Signed)
Pt refused her labs, rn went in and educated pt. Pt agreed for labs to be taken later in the morning.

## 2015-06-22 NOTE — Progress Notes (Signed)
I spent time with pt and her 617 year-old daughter.  She spoke about the emotions of having her baby in the NICU and of the separation that was so unexpected.  She reported that she has a lot of support from her mother, aunt, brother and other family members.  She received a call from her mother and her aunt while I was in the room.  She also spoke about being nervous to meet FOB's family tomorrow--this will be their first time being together.  FOB is a father for the first time, but she reports that he is doing well with it.  She spoke about the stress of being separated from her 25 year old.  She was caring for her 25 year old in the room without anyone else to support her.  She engaged with her child about colors and numbers when I brought her child crayons.    We will continue to follow up as we are able, but please also page as needs arise.  Chaplain Dyanne CarrelKaty Shatasia Cutshaw, bcc Pager, [phone] 4:28 PM

## 2015-06-22 NOTE — Progress Notes (Signed)
Post Partum Day #1 Subjective: no complaints, up ad lib, voiding and tolerating PO  Objective: Blood pressure 100/61, pulse 79, temperature 97.8 F (36.6 C), temperature source Oral, resp. rate 18, height 5\' 8"  (1.727 m), weight 214 lb (97.07 kg), last menstrual period 10/25/2014, SpO2 100 %, unknown if currently breastfeeding.  Physical Exam:  General: alert, cooperative and no distress Lochia: appropriate Uterine Fundus: firm Incision: no significant drainage DVT Evaluation: No evidence of DVT seen on physical exam. No cords or calf tenderness. No significant calf/ankle edema.   Recent Labs  06/20/15 1407  HGB 9.4*  HCT 28.4*    Assessment/Plan: Plan for discharge tomorrow, Breastfeeding, Lactation consult and Social Work consult  Anemic: iron started.   LOS: 6 days   Roe CoombsRachelle A Judiann Celia, CNM 06/22/2015, 7:57 AM

## 2015-06-23 MED ORDER — IBUPROFEN 600 MG PO TABS: 600.0000 mg | ORAL_TABLET | Freq: Four times a day (QID) | ORAL | Status: DC

## 2015-06-23 MED ORDER — SENNOSIDES-DOCUSATE SODIUM 8.6-50 MG PO TABS: 1.0000 | ORAL_TABLET | Freq: Two times a day (BID) | ORAL | Status: DC

## 2015-06-23 MED ORDER — OXYCODONE-ACETAMINOPHEN 5-325 MG PO TABS: 2.0000 | ORAL_TABLET | ORAL | Status: DC | PRN

## 2015-06-23 MED ORDER — FUSION PLUS PO CAPS
1.0000 | ORAL_CAPSULE | Freq: Every day | ORAL | Status: DC
Start: 2015-06-23 — End: 2016-09-06

## 2015-06-23 MED ORDER — COCONUT OIL OIL: 1.0000 "application " | TOPICAL_OIL | Status: DC | PRN

## 2015-06-23 NOTE — Plan of Care (Signed)
Problem: Role Relationship: Goal: Ability to demonstrate positive interaction with newborn will improve Outcome: Completed/Met Date Met:  06/23/15 Going to visit baby in NICU frequently.

## 2015-06-23 NOTE — Lactation Note (Signed)
This note was copied from a baby's chart. Lactation Consultation Note  Follow up visit made prior to discharge.  Mom states she is pumping but not obtaining colostrum.  Encouraged to continue pumping every 3 hours to establish a good milk supply.  [facility]  Encouraged to call with concerns prn.  Patient Name: Amber Griffin WUJWJ'XToday's Date: 06/23/2015     Maternal Data    Feeding Feeding Type: Formula Nipple Type: Slow - flow Length of feed: 15 min  LATCH Score/Interventions                      Lactation Tools Discussed/Used     Consult Status      Huston FoleyMOULDEN, Deazia Lampi S 06/23/2015, 11:08 AM

## 2015-06-23 NOTE — Progress Notes (Signed)
Pt verbalizes understanding of d/c instructions, medications, follow up appts, when to seek medical attention, & belongings policy. Encouraged to check room thoroughly for belongings. Pt has no questions at this time. No IV at time of d/c. Pt states she is ready to sign her papers and seemed to get slightly rushed, though no one in the room was rushing her. Pts mother is in the room and states that she has to work this afternoon and does want to take a nap. Pt signed her papers and left with her family, despite being asked to press her call light and notify staff when she was ready. Sheryn BisonGordon, Arnita Koons Warner

## 2015-06-23 NOTE — Discharge Summary (Signed)
Obstetric Discharge Summary Reason for Admission: PPROM, delivery at 34 weeks Prenatal Procedures: ultrasound Intrapartum Procedures: spontaneous vaginal delivery Postpartum Procedures: none Complications-Operative and Postpartum: none HEMOGLOBIN  Date Value Ref Range Status  06/22/2015 10.4* 12.0 - 15.0 g/dL Final   HCT  Date Value Ref Range Status  06/22/2015 31.5* 36.0 - 46.0 % Final    Physical Exam:  General: alert, cooperative and no distress Lochia: appropriate Uterine Fundus: firm Incision: no significant drainage DVT Evaluation: No evidence of DVT seen on physical exam. No cords or calf tenderness. No significant calf/ankle edema.  Discharge Diagnoses: PROM x84 hours  Discharge Information: Date: 06/23/2015 Activity: pelvic rest Diet: routine Medications: PNV, Ibuprofen, Colace, Iron and Percocet Condition: stable Instructions: refer to practice specific booklet Discharge to: home Follow-up Information    [facility].   Contact information:   [address] [place] [id number] [phone]      Newborn Data: Live born female  Birth Weight: 4 lb 5.5 oz (1970 g) APGAR: 8, 9  NICU admission  Roe CoombsRachelle A Macallister Ashmead, CNM 06/23/2015, 8:30 AM
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
## 2015-09-30 ENCOUNTER — Ambulatory Visit: Payer: Medicaid Other | Admitting: Obstetrics

## 2015-12-07 ENCOUNTER — Inpatient Hospital Stay (HOSPITAL_COMMUNITY): Payer: Medicaid Other

## 2015-12-07 ENCOUNTER — Inpatient Hospital Stay (HOSPITAL_COMMUNITY)
Admission: AD | Admit: 2015-12-07 | Discharge: 2015-12-07 | Disposition: A | Discharge status: Home / Self Care | Payer: Medicaid Other | Source: Ambulatory Visit | Attending: Obstetrics & Gynecology | Admitting: Obstetrics & Gynecology

## 2015-12-07 ENCOUNTER — Encounter (HOSPITAL_COMMUNITY): Payer: Self-pay | Admitting: *Deleted

## 2015-12-07 DIAGNOSIS — O3482 Maternal care for other abnormalities of pelvic organs, second trimester: Secondary | ICD-10-CM | POA: Diagnosis not present

## 2015-12-07 DIAGNOSIS — N83201 Unspecified ovarian cyst, right side: Secondary | ICD-10-CM | POA: Diagnosis not present

## 2015-12-07 DIAGNOSIS — O99332 Smoking (tobacco) complicating pregnancy, second trimester: Secondary | ICD-10-CM | POA: Insufficient documentation

## 2015-12-07 DIAGNOSIS — F17210 Nicotine dependence, cigarettes, uncomplicated: Secondary | ICD-10-CM | POA: Diagnosis not present

## 2015-12-07 DIAGNOSIS — Z3A14 14 weeks gestation of pregnancy: Secondary | ICD-10-CM | POA: Insufficient documentation

## 2015-12-07 DIAGNOSIS — O23592 Infection of other part of genital tract in pregnancy, second trimester: Secondary | ICD-10-CM | POA: Insufficient documentation

## 2015-12-07 DIAGNOSIS — O26899 Other specified pregnancy related conditions, unspecified trimester: Secondary | ICD-10-CM

## 2015-12-07 DIAGNOSIS — O210 Mild hyperemesis gravidarum: Secondary | ICD-10-CM | POA: Diagnosis present

## 2015-12-07 DIAGNOSIS — R109 Unspecified abdominal pain: Secondary | ICD-10-CM

## 2015-12-07 LAB — WET PREP, GENITAL
Sperm: NONE SEEN
Trich, Wet Prep: NONE SEEN
Yeast Wet Prep HPF POC: NONE SEEN

## 2015-12-07 LAB — URINE MICROSCOPIC-ADD ON: RBC / HPF: NONE SEEN RBC/hpf (ref 0–5)

## 2015-12-07 LAB — URINALYSIS, ROUTINE W REFLEX MICROSCOPIC
Bilirubin Urine: NEGATIVE
Glucose, UA: NEGATIVE mg/dL
Hgb urine dipstick: NEGATIVE
Ketones, ur: 15 mg/dL — AB
Leukocytes, UA: NEGATIVE
Nitrite: POSITIVE — AB
Protein, ur: NEGATIVE mg/dL
Specific Gravity, Urine: >1.03 — ABNORMAL HIGH (ref 1.005–1.030)
pH: 6 (ref 5.0–8.0)

## 2015-12-07 LAB — CBC
HCT: 35.9 % — ABNORMAL LOW (ref 36.0–46.0)
Hemoglobin: 12.7 g/dL (ref 12.0–15.0)
MCH: 32.3 pg (ref 26.0–34.0)
MCHC: 35.4 g/dL (ref 30.0–36.0)
MCV: 91.3 fL (ref 78.0–100.0)
Platelets: 228 10*3/uL (ref 150–400)
RBC: 3.93 MIL/uL (ref 3.87–5.11)
RDW: 13.9 % (ref 11.5–15.5)
WBC: 5.9 10*3/uL (ref 4.0–10.5)

## 2015-12-07 LAB — POCT PREGNANCY, URINE: Preg Test, Ur: POSITIVE — AB

## 2015-12-07 LAB — HCG, QUANTITATIVE, PREGNANCY: hCG, Beta Chain, Quant, S: 95116 m[IU]/mL — ABNORMAL HIGH (ref <5)

## 2015-12-07 MED ORDER — PROMETHAZINE HCL 25 MG/ML IJ SOLN: Freq: Once | INTRAVENOUS | Status: DC

## 2015-12-07 MED ORDER — NITROFURANTOIN MONOHYD MACRO 100 MG PO CAPS: 100.0000 mg | ORAL_CAPSULE | Freq: Two times a day (BID) | ORAL | 0 refills | Status: DC

## 2015-12-07 MED ORDER — MECLIZINE HCL 25 MG PO TABS: 25.0000 mg | ORAL_TABLET | Freq: Three times a day (TID) | ORAL | 0 refills | Status: DC | PRN

## 2015-12-07 MED ORDER — PROMETHAZINE HCL 25 MG PO TABS: 25.0000 mg | ORAL_TABLET | Freq: Four times a day (QID) | ORAL | 0 refills | Status: DC | PRN

## 2015-12-07 MED ORDER — METRONIDAZOLE 500 MG PO TABS: 500.0000 mg | ORAL_TABLET | Freq: Two times a day (BID) | ORAL | 0 refills | Status: DC

## 2015-12-07 MED ORDER — PROMETHAZINE HCL 25 MG/ML IJ SOLN
Freq: Once | INTRAMUSCULAR | Status: AC
  Administered 2015-12-07: 17:00:00 via INTRAVENOUS
  Filled 2015-12-07: qty 1000

## 2015-12-07 NOTE — MAU Note (Signed)
C/o intermittent abdominal cramping for past 3 weeks; intermittent spotting for past 2-3 weeks; c/o N&V for past 2-3 weeks;

## 2015-12-07 NOTE — MAU Note (Signed)
Haven't been feeling good for the past month.  Hasn't had a period since July, has taken preg tests, all have been neg, last was 3 wks ago.   Vomiting- not keeping anything down., severe abd pain. Started spotting a wk ago.

## 2015-12-07 NOTE — Discharge Instructions (Signed)

## 2015-12-07 NOTE — MAU Provider Note (Signed)
History     CSN: [id number]  Arrival date and time: 12/07/15 1517   None     Chief Complaint  Patient presents with  . Vaginal Bleeding  . Abdominal Pain  . Emesis  . Possible Pregnancy   HPI Pt is 62 [id number] at ?[redacted]w[redacted]d who presents with nausea and vomiting for about 2 weeks and lower abd pain.  Pt noticed spotting 1 weeks ago.  Pt has had 2 periods since NSVD in June.  Pt also notes some white discharge with odor.  Pt does not have pain with urination but has some urgency.   Pt denies fever or chills.has some constipation Pt has not taken anything for the pain or vomiting. Pt plans to go to [facility] RN note: [] Hover for attribution information Haven't been feeling good for the past month.  Hasn't had a period since July, has taken preg tests, all have been neg, last was 3 wks ago.   Vomiting- not keeping anything down., severe abd pain. Started spotting a wk ago.  Past Medical History:  Diagnosis Date  . Migraines     Past Surgical History:  Procedure Laterality Date  . DILATION AND EVACUATION N/A 11/21/2012   Procedure: DILATATION AND EVACUATION;  Surgeon: Catalina Antigua, MD;  Location: [facility];  Service: Gynecology;  Laterality: N/A;  . vaginal hematoma  2010   after birth, to OR    Family History  Problem Relation Age of Onset  . Other Neg Hx   . Hypertension Mother   . Anxiety disorder Mother     Social History  Substance Use Topics  . Smoking status: Current Some Day Smoker    Packs/day: 1.00  . Smokeless tobacco: Never Used  . Alcohol use No     Comment: Socially not since pregnancy    Allergies:  Allergies  Allergen Reactions  . Other Anaphylaxis    Seafood - pt states that she is not longer allergic   . Shellfish Allergy     Throat swells and difficulty breathing, said has had since with no issues pt states that she is not longer allergic     Prescriptions Prior to Admission  Medication Sig Dispense Refill Last Dose  . coconut oil OIL Apply  1 application topically as needed. 500 mL PRN   . ibuprofen (ADVIL,MOTRIN) 600 MG tablet Take 1 tablet (600 mg total) by mouth every 6 (six) hours. 120 tablet 2   . Iron-FA-B Cmp-C-Biot-Probiotic (FUSION PLUS) CAPS Take 1 tablet by mouth daily. 30 capsule 6   . oxyCODONE-acetaminophen (PERCOCET/ROXICET) 5-325 MG tablet Take 2 tablets by mouth every 4 (four) hours as needed (pain scale > 7). 45 tablet 0   . Prenatal Vit-Fe Phos-FA-Omega (VITAFOL GUMMIES) 3.33-0.333-34.8 MG CHEW Chew 3 tablets by mouth as directed. 90 tablet 11 06/16/2015 at Unknown time  . senna-docusate (SENOKOT-S) 8.6-50 MG tablet Take 1 tablet by mouth 2 (two) times daily. 90 tablet 2   . sertraline (ZOLOFT) 25 MG tablet Take 1 tablet (25 mg total) by mouth daily. 30 tablet 11 06/15/2015 at Unknown time    Review of Systems  Constitutional: Negative for chills and fever.  Respiratory: Negative for cough.   Gastrointestinal: Positive for abdominal pain, constipation, nausea and vomiting.  Genitourinary: Positive for frequency.  Neurological: Negative for headaches.   Physical Exam   Blood pressure 111/71, pulse 85, temperature 98.2 F (36.8 C), temperature source Oral, resp. rate 16, weight 196 lb 8 oz (89.1 kg), last menstrual period 08/30/2015, unknown  if currently breastfeeding.  Physical Exam  Vitals reviewed. Constitutional: She is oriented to person, place, and time. She appears well-developed and well-nourished.  HENT:  Head: Normocephalic.  Eyes: Pupils are equal, round, and reactive to light.  Neck: Normal range of motion. Neck supple.  Cardiovascular: Normal rate.   Respiratory: Effort normal.  GI: Soft. She exhibits no distension. There is no tenderness. There is no rebound.  Genitourinary:  Genitourinary Comments: Small amount of white discharge in vault; cervix closed, parous.  Mild suprapubic tenderness  Musculoskeletal: Normal range of motion.  Neurological: She is alert and oriented to person, place,  and time.  Skin: Skin is warm and dry.  Psychiatric: She has a normal mood and affect.    MAU Course  Procedures Orders Placed This Encounter  Procedures  . Culture, OB Urine    Standing Status:   Standing    Number of Occurrences:   1  . Wet prep, genital    Standing Status:   Standing    Number of Occurrences:   1  . US OB Comp Less 14 Wks    Standing Status:   Standing    Number of Occurrences:   1    Order Specific Question:   What location should the exam be performed?    Answer:   [facility]    Order Specific Question:   Symptom/Reason for Exam    Answer:   Abdominal pain in pregnancy [454098]  . US OB Transvaginal    Standing Status:   Standing    Number of Occurrences:   1    Order Specific Question:   What location should the exam be performed?    Answer:   [facility]    Order Specific Question:   Symptom/Reason for Exam    Answer:   Abdominal pain in pregnancy [119147]  . Urinalysis, Routine w reflex microscopic (not at [facility])    Standing Status:   Standing    Number of Occurrences:   1  . Urine microscopic-add on    Standing Status:   Standing    Number of Occurrences:   1  . CBC    Standing Status:   Standing    Number of Occurrences:   1  . HIV antibody (routine testing) (NOT for [facility])    Standing Status:   Standing    Number of Occurrences:   1  . hCG, quantitative, pregnancy    Standing Status:   Standing    Number of Occurrences:   1  . Pregnancy, urine POC    Standing Status:   Standing    Number of Occurrences:   1  IV D5LR with phenergan 25mg  Results for orders placed or performed during the hospital encounter of 12/07/15 (from the past 24 hour(s))  Urinalysis, Routine w reflex microscopic (not at [facility])     Status: Abnormal   Collection Time: 12/07/15  3:40 PM  Result Value Ref Range   Color, Urine YELLOW YELLOW   APPearance CLEAR CLEAR   Specific Gravity, Urine >1.030 (H) 1.005 - 1.030   pH 6.0 5.0 - 8.0   Glucose, UA NEGATIVE NEGATIVE  mg/dL   Hgb urine dipstick NEGATIVE NEGATIVE   Bilirubin Urine NEGATIVE NEGATIVE   Ketones, ur 15 (A) NEGATIVE mg/dL   Protein, ur NEGATIVE NEGATIVE mg/dL   Nitrite POSITIVE (A) NEGATIVE   Leukocytes, UA NEGATIVE NEGATIVE  Urine microscopic-add on     Status: Abnormal   Collection Time: 12/07/15  3:40 PM  Result Value Ref Range   Squamous Epithelial / LPF 6-30 (A) NONE SEEN   WBC, UA 6-30 0 - 5 WBC/hpf   RBC / HPF NONE SEEN 0 - 5 RBC/hpf   Bacteria, UA MANY (A) NONE SEEN  Pregnancy, urine POC     Status: Abnormal   Collection Time: 12/07/15  4:14 PM  Result Value Ref Range   Preg Test, Ur POSITIVE (A) NEGATIVE  CBC     Status: Abnormal   Collection Time: 12/07/15  5:00 PM  Result Value Ref Range   WBC 5.9 4.0 - 10.5 K/uL   RBC 3.93 3.87 - 5.11 MIL/uL   Hemoglobin 12.7 12.0 - 15.0 g/dL   HCT 16.135.9 (L) 09.636.0 - 04.546.0 %   MCV 91.3 78.0 - 100.0 fL   MCH 32.3 26.0 - 34.0 pg   MCHC 35.4 30.0 - 36.0 g/dL   RDW 40.913.9 81.111.5 - 91.415.5 %   Platelets 228 150 - 400 K/uL  hCG, quantitative, pregnancy     Status: Abnormal   Collection Time: 12/07/15  5:00 PM  Result Value Ref Range   hCG, Beta Chain, Quant, S 95,116 (H) <5 mIU/mL  Wet prep, genital     Status: Abnormal   Collection Time: 12/07/15  5:45 PM  Result Value Ref Range   Yeast Wet Prep HPF POC NONE SEEN NONE SEEN   Trich, Wet Prep NONE SEEN NONE SEEN   Clue Cells Wet Prep HPF POC PRESENT (A) NONE SEEN   WBC, Wet Prep HPF POC MODERATE (A) NONE SEEN   Sperm NONE SEEN   GC/chlamydia pending [place] Ob Comp Less 14 Wks  Result Date: 12/07/2015 CLINICAL DATA:  Abdominal pain and spotting for 1 week EXAM: OBSTETRIC <14 WK ULTRASOUND TECHNIQUE: Transvaginal ultrasound was performed for complete evaluation of the gestation as well as the maternal uterus, adnexal regions, and pelvic cul-de-sac. COMPARISON:  None. FINDINGS: Intrauterine gestational sac: Single Yolk sac:  Visualized. Embryo:  Visualized. Cardiac Activity: Visualized. Heart Rate: 167  bpm CRL:   38  mm   10 w 4 d                  US EDC: 06/30/2016 Subchorionic hemorrhage:  None visualized. Maternal uterus/adnexae: There is a 6.1 x 5.4 x 6 cm complex cyst associated with the right ovary with thin septations and internal debris likely representing a hemorrhagic cyst. It demonstrates no internal vascularity. The right ovaryl measures 5.8 x 4.7 x 5.2 cm and the left measures 2.4 x 2.9 x 2.5 cm. IMPRESSION: Single live intrauterine 10 week 4 day gestation. Complex right ovarian with benign appearing features of a hemorrhagic cyst given its reticular pattern and no internal flow within. As it measures greater than 5 cm (6.1 x 5.4 x 6 cm), per SRU guidelines in Management of Asymptomatic Ovarian and Other Adnexal Cysts Imaged at US: Society of Radiologists in Ultrasound Consensus Conference Statement, a follow up in 6-12 weeks is recommended to assure resolution. Electronically Signed   By: Tollie Ethavid  Kwon M.D.   On: 12/07/2015 19:07    Assessment and Plan  Abdominal pain in pregnancy Viable IUP [id number] Complex right ovarian cyst- hemorrhagic- f/u with [facility] Nausea and vomiting in pregnancy- Rx phenergan, antivert UTI- Macrobid RX- urine culture pending BV- Flagyl 500mg  BID    Auston Halfmann 12/07/2015, 4:12 PM

## 2015-12-08 LAB — GC/CHLAMYDIA PROBE AMP (~~LOC~~) NOT AT ARMC
Chlamydia: NEGATIVE
Neisseria Gonorrhea: NEGATIVE

## 2015-12-08 LAB — HIV ANTIBODY (ROUTINE TESTING W REFLEX): HIV Screen 4th Generation wRfx: NONREACTIVE

## 2015-12-09 LAB — CULTURE, OB URINE: Culture: 70000 — AB

## 2016-02-21 NOTE — L&D Delivery Note (Signed)
Delivery Note At 8:44 AM a viable female was delivered via Vaginal, Spontaneous Delivery (Presentation: LOA).  APGAR: 9, 9; weight  pending.   Placenta status: Spontaneous, intact.  Cord: 3 vessels, clamped and cut after 60 seconds.   Anesthesia:  epidural Episiotomy: None Lacerations: None Suture Repair: n/a Est. Blood Loss (mL): 50  Mom to postpartum.  Baby to Couplet care / Skin to Skin.  Cleone SlimCaroline Neill SNM 07/02/2016, 8:54 AM

## 2016-04-28 ENCOUNTER — Inpatient Hospital Stay (HOSPITAL_COMMUNITY)
Admission: AD | Admit: 2016-04-28 | Discharge: 2016-04-28 | Disposition: A | Discharge status: Home / Self Care | Payer: Medicaid Other | Source: Ambulatory Visit | Attending: Obstetrics & Gynecology | Admitting: Obstetrics & Gynecology

## 2016-04-28 ENCOUNTER — Encounter (HOSPITAL_COMMUNITY): Payer: Self-pay

## 2016-04-28 DIAGNOSIS — O219 Vomiting of pregnancy, unspecified: Secondary | ICD-10-CM | POA: Diagnosis not present

## 2016-04-28 DIAGNOSIS — O09213 Supervision of pregnancy with history of pre-term labor, third trimester: Secondary | ICD-10-CM | POA: Insufficient documentation

## 2016-04-28 DIAGNOSIS — O26893 Other specified pregnancy related conditions, third trimester: Secondary | ICD-10-CM | POA: Insufficient documentation

## 2016-04-28 DIAGNOSIS — R110 Nausea: Secondary | ICD-10-CM | POA: Diagnosis not present

## 2016-04-28 DIAGNOSIS — Z79899 Other long term (current) drug therapy: Secondary | ICD-10-CM | POA: Diagnosis not present

## 2016-04-28 DIAGNOSIS — Z87891 Personal history of nicotine dependence: Secondary | ICD-10-CM | POA: Diagnosis not present

## 2016-04-28 DIAGNOSIS — Z91013 Allergy to seafood: Secondary | ICD-10-CM | POA: Diagnosis not present

## 2016-04-28 DIAGNOSIS — Z3A31 31 weeks gestation of pregnancy: Secondary | ICD-10-CM | POA: Diagnosis not present

## 2016-04-28 DIAGNOSIS — Z9889 Other specified postprocedural states: Secondary | ICD-10-CM | POA: Diagnosis not present

## 2016-04-28 DIAGNOSIS — Z888 Allergy status to other drugs, medicaments and biological substances status: Secondary | ICD-10-CM | POA: Insufficient documentation

## 2016-04-28 LAB — URINALYSIS, ROUTINE W REFLEX MICROSCOPIC
Bilirubin Urine: NEGATIVE
Glucose, UA: NEGATIVE mg/dL
Hgb urine dipstick: NEGATIVE
Ketones, ur: NEGATIVE mg/dL
Leukocytes, UA: NEGATIVE
Nitrite: NEGATIVE
Protein, ur: NEGATIVE mg/dL
Specific Gravity, Urine: 1.012 (ref 1.005–1.030)
pH: 9 — ABNORMAL HIGH (ref 5.0–8.0)

## 2016-04-28 MED ORDER — PROMETHAZINE HCL 25 MG PO TABS: 25.0000 mg | ORAL_TABLET | Freq: Four times a day (QID) | ORAL | 1 refills | Status: DC | PRN

## 2016-04-28 MED ORDER — PROMETHAZINE HCL 25 MG PO TABS
12.5000 mg | ORAL_TABLET | Freq: Four times a day (QID) | ORAL | Status: DC | PRN
  Administered 2016-04-28: 12.5 mg via ORAL
  Filled 2016-04-28: qty 1

## 2016-04-28 NOTE — MAU Note (Signed)
Patient presents with N/V and confirmation of pregnancy, was told pregnant here.
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
## 2016-05-08 ENCOUNTER — Encounter: Payer: Medicaid Other | Admitting: Obstetrics

## 2016-05-12 ENCOUNTER — Ambulatory Visit (HOSPITAL_COMMUNITY): Admission: RE | Admit: 2016-05-12 | Payer: Medicaid Other | Source: Ambulatory Visit

## 2016-05-26 ENCOUNTER — Ambulatory Visit (HOSPITAL_COMMUNITY)
Admission: EM | Admit: 2016-05-26 | Discharge: 2016-05-26 | Disposition: A | Discharge status: Home / Self Care | Payer: Medicaid Other | Attending: Internal Medicine | Admitting: Internal Medicine

## 2016-05-26 ENCOUNTER — Encounter (HOSPITAL_COMMUNITY): Payer: Self-pay | Admitting: Emergency Medicine

## 2016-05-26 DIAGNOSIS — B9789 Other viral agents as the cause of diseases classified elsewhere: Secondary | ICD-10-CM

## 2016-05-26 DIAGNOSIS — J069 Acute upper respiratory infection, unspecified: Secondary | ICD-10-CM | POA: Diagnosis not present

## 2016-05-26 MED ORDER — IPRATROPIUM BROMIDE 0.06 % NA SOLN
2.0000 | Freq: Four times a day (QID) | NASAL | 12 refills | Status: DC
Start: 2016-05-26 — End: 2017-09-19
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
## 2016-05-26 NOTE — Discharge Instructions (Signed)
You have a viral respiratory infection. For congestion, runny nose, and postnasal drip, I prescribed ipratropium nasal spray. You may have over-the-counter Tylenol for fever, headache, body ache, and muscle aches. I would avoid ibuprofen at this stage in your pregnancy. You should recover from your symptoms in 3-4 days, if not, follow up with your OB.

## 2016-05-26 NOTE — ED Triage Notes (Signed)
Pt c/o cold sx onset: 4 days  Sx include: prod cough, CP due to cough, nasal drainage, HA, right arm pain   Denies: fevers  Taking: OTC cold meds w/temp relief.   A&O x4... NAD

## 2016-06-01 ENCOUNTER — Ambulatory Visit (HOSPITAL_COMMUNITY): Payer: Medicaid Other | Attending: Obstetrics and Gynecology

## 2016-07-01 ENCOUNTER — Encounter (HOSPITAL_COMMUNITY): Payer: Self-pay | Admitting: *Deleted

## 2016-07-01 ENCOUNTER — Inpatient Hospital Stay (HOSPITAL_COMMUNITY)
Admission: AD | Admit: 2016-07-01 | Discharge: 2016-07-04 | DRG: 775 | Disposition: A | Discharge status: Home / Self Care | Payer: Medicaid Other | Source: Ambulatory Visit | Attending: Obstetrics & Gynecology | Admitting: Obstetrics & Gynecology

## 2016-07-01 DIAGNOSIS — Z87891 Personal history of nicotine dependence: Secondary | ICD-10-CM

## 2016-07-01 DIAGNOSIS — O99824 Streptococcus B carrier state complicating childbirth: Secondary | ICD-10-CM | POA: Diagnosis present

## 2016-07-01 DIAGNOSIS — Z8249 Family history of ischemic heart disease and other diseases of the circulatory system: Secondary | ICD-10-CM | POA: Diagnosis not present

## 2016-07-01 DIAGNOSIS — O480 Post-term pregnancy: Principal | ICD-10-CM | POA: Diagnosis present

## 2016-07-01 DIAGNOSIS — Z3A40 40 weeks gestation of pregnancy: Secondary | ICD-10-CM | POA: Diagnosis not present

## 2016-07-01 DIAGNOSIS — O09219 Supervision of pregnancy with history of pre-term labor, unspecified trimester: Secondary | ICD-10-CM

## 2016-07-01 DIAGNOSIS — O0933 Supervision of pregnancy with insufficient antenatal care, third trimester: Secondary | ICD-10-CM

## 2016-07-01 HISTORY — DX: Other maternal infectious and parasitic diseases complicating pregnancy, unspecified trimester: O98.819

## 2016-07-01 HISTORY — DX: Chlamydial infection, unspecified: A74.9

## 2016-07-01 LAB — RAPID URINE DRUG SCREEN, HOSP PERFORMED
Amphetamines: NOT DETECTED
Barbiturates: NOT DETECTED
Benzodiazepines: NOT DETECTED
Cocaine: NOT DETECTED
Opiates: NOT DETECTED
Tetrahydrocannabinol: POSITIVE — AB

## 2016-07-01 LAB — CBC
HCT: 28.4 % — ABNORMAL LOW (ref 36.0–46.0)
Hemoglobin: 9.6 g/dL — ABNORMAL LOW (ref 12.0–15.0)
MCH: 29.8 pg (ref 26.0–34.0)
MCHC: 33.8 g/dL (ref 30.0–36.0)
MCV: 88.2 fL (ref 78.0–100.0)
Platelets: 228 10*3/uL (ref 150–400)
RBC: 3.22 MIL/uL — ABNORMAL LOW (ref 3.87–5.11)
RDW: 15.7 % — ABNORMAL HIGH (ref 11.5–15.5)
WBC: 8.8 10*3/uL (ref 4.0–10.5)

## 2016-07-01 LAB — DIFFERENTIAL
Basophils Absolute: 0 10*3/uL (ref 0.0–0.1)
Basophils Relative: 0 %
Eosinophils Absolute: 0.1 10*3/uL (ref 0.0–0.7)
Eosinophils Relative: 2 %
Lymphocytes Relative: 22 %
Lymphs Abs: 2 10*3/uL (ref 0.7–4.0)
Monocytes Absolute: 0.3 10*3/uL (ref 0.1–1.0)
Monocytes Relative: 3 %
Neutro Abs: 6.4 10*3/uL (ref 1.7–7.7)
Neutrophils Relative %: 73 %

## 2016-07-01 LAB — OB RESULTS CONSOLE GBS: GBS: POSITIVE

## 2016-07-01 LAB — TYPE AND SCREEN
ABO/RH(D): A POS
Antibody Screen: NEGATIVE

## 2016-07-01 LAB — RAPID HIV SCREEN (HIV 1/2 AB+AG)
HIV 1/2 Antibodies: NONREACTIVE
HIV-1 P24 Antigen - HIV24: NONREACTIVE

## 2016-07-01 LAB — GROUP B STREP BY PCR: Group B strep by PCR: POSITIVE — AB

## 2016-07-01 MED ORDER — OXYCODONE-ACETAMINOPHEN 5-325 MG PO TABS: 1.0000 | ORAL_TABLET | ORAL | Status: DC | PRN

## 2016-07-01 MED ORDER — TERBUTALINE SULFATE 1 MG/ML IJ SOLN
0.2500 mg | Freq: Once | INTRAMUSCULAR | Status: AC | PRN
  Administered 2016-07-02: 0.25 mg via SUBCUTANEOUS
  Filled 2016-07-01: qty 1

## 2016-07-01 MED ORDER — DEXTROSE 5 % IV SOLN
5.0000 10*6.[IU] | Freq: Once | INTRAVENOUS | Status: AC
  Administered 2016-07-01: 5 10*6.[IU] via INTRAVENOUS
  Filled 2016-07-01: qty 5

## 2016-07-01 MED ORDER — ONDANSETRON HCL 4 MG/2ML IJ SOLN: 4.0000 mg | Freq: Four times a day (QID) | INTRAMUSCULAR | Status: DC | PRN

## 2016-07-01 MED ORDER — SOD CITRATE-CITRIC ACID 500-334 MG/5ML PO SOLN: 30.0000 mL | ORAL | Status: DC | PRN

## 2016-07-01 MED ORDER — FLEET ENEMA 7-19 GM/118ML RE ENEM: 1.0000 | ENEMA | RECTAL | Status: DC | PRN

## 2016-07-01 MED ORDER — OXYCODONE-ACETAMINOPHEN 5-325 MG PO TABS: 2.0000 | ORAL_TABLET | ORAL | Status: DC | PRN

## 2016-07-01 MED ORDER — OXYTOCIN 40 UNITS IN LACTATED RINGERS INFUSION - SIMPLE MED
1.0000 m[IU]/min | INTRAVENOUS | Status: DC
  Administered 2016-07-01: 2 m[IU]/min via INTRAVENOUS
  Filled 2016-07-01: qty 1000

## 2016-07-01 MED ORDER — PENICILLIN G POT IN DEXTROSE 60000 UNIT/ML IV SOLN
3.0000 10*6.[IU] | INTRAVENOUS | Status: DC
  Administered 2016-07-02 (×2): 3 10*6.[IU] via INTRAVENOUS
  Filled 2016-07-01 (×4): qty 50

## 2016-07-01 MED ORDER — OXYTOCIN 40 UNITS IN LACTATED RINGERS INFUSION - SIMPLE MED: 2.5000 [IU]/h | INTRAVENOUS | Status: DC

## 2016-07-01 MED ORDER — ACETAMINOPHEN 325 MG PO TABS
650.0000 mg | ORAL_TABLET | ORAL | Status: DC | PRN
  Administered 2016-07-02: 650 mg via ORAL
  Filled 2016-07-01: qty 2

## 2016-07-01 MED ORDER — LACTATED RINGERS IV SOLN
INTRAVENOUS | Status: DC
  Administered 2016-07-01 – 2016-07-02 (×2): via INTRAVENOUS

## 2016-07-01 MED ORDER — LACTATED RINGERS IV SOLN
500.0000 mL | INTRAVENOUS | Status: DC | PRN
  Administered 2016-07-02 (×2): 500 mL via INTRAVENOUS

## 2016-07-01 MED ORDER — LIDOCAINE HCL (PF) 1 % IJ SOLN
30.0000 mL | INTRAMUSCULAR | Status: DC | PRN
  Filled 2016-07-01: qty 30

## 2016-07-01 MED ORDER — FENTANYL CITRATE (PF) 100 MCG/2ML IJ SOLN: 100.0000 ug | INTRAMUSCULAR | Status: DC | PRN

## 2016-07-01 MED ORDER — OXYTOCIN BOLUS FROM INFUSION: 500.0000 mL | Freq: Once | INTRAVENOUS | Status: DC

## 2016-07-01 NOTE — Progress Notes (Signed)
Bedside ultrasound was done by Dr. Omer JackMumaw to verify presentation; showed adequate AFV, cephalic presentation.  Amber Griffin, Faiga Stones A, MD
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
## 2016-07-01 NOTE — MAU Note (Signed)
Having contractions, started yesterday. 6-3210min. No bleeding or leaking.  Has had a bad headache the past few hrs.  Denies visual changes, epigastric pain or swelling.missed her last appt, wondering if she is going to be induced or go into labor on her own

## 2016-07-01 NOTE — MAU Note (Signed)
Urine sent to lab 

## 2016-07-02 ENCOUNTER — Encounter (HOSPITAL_COMMUNITY): Payer: Self-pay | Admitting: *Deleted

## 2016-07-02 ENCOUNTER — Inpatient Hospital Stay (HOSPITAL_COMMUNITY): Payer: Medicaid Other | Admitting: Anesthesiology

## 2016-07-02 DIAGNOSIS — Z3A40 40 weeks gestation of pregnancy: Secondary | ICD-10-CM

## 2016-07-02 DIAGNOSIS — O99824 Streptococcus B carrier state complicating childbirth: Secondary | ICD-10-CM

## 2016-07-02 LAB — HEPATITIS B SURFACE ANTIGEN: Hepatitis B Surface Ag: NEGATIVE

## 2016-07-02 LAB — RPR: RPR Ser Ql: NONREACTIVE

## 2016-07-02 LAB — RUBELLA SCREEN: Rubella: 1.49 index (ref >0.99)

## 2016-07-02 MED ORDER — PHENYLEPHRINE 40 MCG/ML (10ML) SYRINGE FOR IV PUSH (FOR BLOOD PRESSURE SUPPORT)
PREFILLED_SYRINGE | INTRAVENOUS | Status: AC
  Filled 2016-07-02: qty 20

## 2016-07-02 MED ORDER — ONDANSETRON HCL 4 MG PO TABS: 4.0000 mg | ORAL_TABLET | ORAL | Status: DC | PRN

## 2016-07-02 MED ORDER — DIPHENHYDRAMINE HCL 50 MG/ML IJ SOLN: 12.5000 mg | INTRAMUSCULAR | Status: DC | PRN

## 2016-07-02 MED ORDER — METHYLERGONOVINE MALEATE 0.2 MG PO TABS: 0.2000 mg | ORAL_TABLET | ORAL | Status: DC | PRN

## 2016-07-02 MED ORDER — TETANUS-DIPHTH-ACELL PERTUSSIS 5-2.5-18.5 LF-MCG/0.5 IM SUSP
0.5000 mL | Freq: Once | INTRAMUSCULAR | Status: AC
  Administered 2016-07-03: 0.5 mL via INTRAMUSCULAR
  Filled 2016-07-02: qty 0.5

## 2016-07-02 MED ORDER — PHENYLEPHRINE 40 MCG/ML (10ML) SYRINGE FOR IV PUSH (FOR BLOOD PRESSURE SUPPORT): 80.0000 ug | PREFILLED_SYRINGE | INTRAVENOUS | Status: DC | PRN

## 2016-07-02 MED ORDER — PHENYLEPHRINE 40 MCG/ML (10ML) SYRINGE FOR IV PUSH (FOR BLOOD PRESSURE SUPPORT)
80.0000 ug | PREFILLED_SYRINGE | INTRAVENOUS | Status: DC | PRN
Start: 2016-07-02 — End: 2016-07-02
  Filled 2016-07-02: qty 5
  Filled 2016-07-02: qty 10

## 2016-07-02 MED ORDER — DIBUCAINE 1 % RE OINT
1.0000 | TOPICAL_OINTMENT | RECTAL | Status: DC | PRN
Start: 2016-07-02 — End: 2016-07-04

## 2016-07-02 MED ORDER — ONDANSETRON HCL 4 MG/2ML IJ SOLN: 4.0000 mg | INTRAMUSCULAR | Status: DC | PRN

## 2016-07-02 MED ORDER — BENZOCAINE-MENTHOL 20-0.5 % EX AERO: 1.0000 "application " | INHALATION_SPRAY | CUTANEOUS | Status: DC | PRN

## 2016-07-02 MED ORDER — PROMETHAZINE HCL 25 MG/ML IJ SOLN
25.0000 mg | Freq: Four times a day (QID) | INTRAMUSCULAR | Status: DC | PRN
  Administered 2016-07-02: 25 mg via INTRAVENOUS
  Filled 2016-07-02: qty 1

## 2016-07-02 MED ORDER — COCONUT OIL OIL: 1.0000 "application " | TOPICAL_OIL | Status: DC | PRN

## 2016-07-02 MED ORDER — OXYCODONE HCL 5 MG PO TABS: 5.0000 mg | ORAL_TABLET | ORAL | Status: DC | PRN

## 2016-07-02 MED ORDER — FENTANYL 2.5 MCG/ML BUPIVACAINE 1/10 % EPIDURAL INFUSION (WH - ANES)
14.0000 mL/h | INTRAMUSCULAR | Status: DC | PRN
  Administered 2016-07-02 (×2): 14 mL/h via EPIDURAL
  Filled 2016-07-02: qty 100

## 2016-07-02 MED ORDER — FENTANYL 2.5 MCG/ML BUPIVACAINE 1/10 % EPIDURAL INFUSION (WH - ANES)
INTRAMUSCULAR | Status: AC
  Filled 2016-07-02: qty 100

## 2016-07-02 MED ORDER — MEASLES, MUMPS & RUBELLA VAC ~~LOC~~ INJ
0.5000 mL | INJECTION | Freq: Once | SUBCUTANEOUS | Status: DC
  Filled 2016-07-02: qty 0.5

## 2016-07-02 MED ORDER — FENTANYL 2.5 MCG/ML BUPIVACAINE 1/10 % EPIDURAL INFUSION (WH - ANES): 12.0000 mL/h | INTRAMUSCULAR | Status: DC | PRN

## 2016-07-02 MED ORDER — PHENYLEPHRINE 40 MCG/ML (10ML) SYRINGE FOR IV PUSH (FOR BLOOD PRESSURE SUPPORT)
80.0000 ug | PREFILLED_SYRINGE | INTRAVENOUS | Status: DC | PRN
  Administered 2016-07-02: 80 ug via INTRAVENOUS
  Filled 2016-07-02: qty 5

## 2016-07-02 MED ORDER — WITCH HAZEL-GLYCERIN EX PADS: 1.0000 "application " | MEDICATED_PAD | CUTANEOUS | Status: DC | PRN

## 2016-07-02 MED ORDER — SODIUM BICARBONATE 8.4 % IV SOLN
INTRAVENOUS | Status: DC | PRN
  Administered 2016-07-02: 3 mL via EPIDURAL
  Administered 2016-07-02: 5 mL via EPIDURAL

## 2016-07-02 MED ORDER — IBUPROFEN 600 MG PO TABS
600.0000 mg | ORAL_TABLET | Freq: Four times a day (QID) | ORAL | Status: DC
  Administered 2016-07-02 – 2016-07-04 (×9): 600 mg via ORAL
  Filled 2016-07-02 (×9): qty 1

## 2016-07-02 MED ORDER — EPHEDRINE 5 MG/ML INJ: 10.0000 mg | INTRAVENOUS | Status: DC | PRN

## 2016-07-02 MED ORDER — EPHEDRINE 5 MG/ML INJ
10.0000 mg | INTRAVENOUS | Status: DC | PRN
Start: 2016-07-02 — End: 2016-07-02
  Filled 2016-07-02: qty 2

## 2016-07-02 MED ORDER — SIMETHICONE 80 MG PO CHEW: 80.0000 mg | CHEWABLE_TABLET | ORAL | Status: DC | PRN

## 2016-07-02 MED ORDER — LACTATED RINGERS IV SOLN
500.0000 mL | Freq: Once | INTRAVENOUS | Status: AC
  Administered 2016-07-02: 500 mL via INTRAVENOUS

## 2016-07-02 MED ORDER — ZOLPIDEM TARTRATE 5 MG PO TABS: 5.0000 mg | ORAL_TABLET | Freq: Every evening | ORAL | Status: DC | PRN

## 2016-07-02 MED ORDER — ACETAMINOPHEN 325 MG PO TABS
650.0000 mg | ORAL_TABLET | ORAL | Status: DC | PRN
  Administered 2016-07-02 – 2016-07-03 (×2): 650 mg via ORAL
  Filled 2016-07-02 (×2): qty 2

## 2016-07-02 MED ORDER — DIPHENHYDRAMINE HCL 25 MG PO CAPS: 25.0000 mg | ORAL_CAPSULE | Freq: Four times a day (QID) | ORAL | Status: DC | PRN

## 2016-07-02 MED ORDER — OXYCODONE HCL 5 MG PO TABS
10.0000 mg | ORAL_TABLET | ORAL | Status: DC | PRN
  Administered 2016-07-02 – 2016-07-04 (×7): 10 mg via ORAL
  Filled 2016-07-02 (×7): qty 2

## 2016-07-02 MED ORDER — LACTATED RINGERS IV SOLN: 500.0000 mL | Freq: Once | INTRAVENOUS | Status: DC

## 2016-07-02 MED ORDER — METHYLERGONOVINE MALEATE 0.2 MG/ML IJ SOLN: 0.2000 mg | INTRAMUSCULAR | Status: DC | PRN

## 2016-07-02 MED ORDER — PRENATAL MULTIVITAMIN CH
1.0000 | ORAL_TABLET | Freq: Every day | ORAL | Status: DC
  Administered 2016-07-02 – 2016-07-04 (×3): 1 via ORAL
  Filled 2016-07-02 (×3): qty 1

## 2016-07-02 MED ORDER — SENNOSIDES-DOCUSATE SODIUM 8.6-50 MG PO TABS
2.0000 | ORAL_TABLET | ORAL | Status: DC
  Administered 2016-07-02 – 2016-07-03 (×2): 2 via ORAL
  Filled 2016-07-02 (×2): qty 2

## 2016-07-02 NOTE — Progress Notes (Signed)
Patient ID: Amber Griffin, female   DOB: 04/13/1990, 26 y.o.   MRN: [id number] Asked to place IUPC due to inability to trace contractions  Patient is hyperreactive to any stimuli When touched anywhere, she cries out and acts as if you are hurting her. Seems oriented though somnolent (got Phenergan 3 hrs ago)  FHR reactive with small decels, early UCs every 2 min per IUPC  IUPC placed without difficulty, though patient gasped when insertion begun  Dilation: 4 Effacement (%): 80 Cervical Position: Middle Station: -2 Presentation: Vertex Exam by:: Mayford KnifeWilliams CNM  Will continue to observe

## 2016-07-02 NOTE — Progress Notes (Signed)
Patient ID: Amber Griffin, female   DOB: 01-24-1991, 26 y.o.   MRN: [id number] Doing well with epidural  Vitals:   07/02/16 0127 07/02/16 0131 07/02/16 0208 07/02/16 0301  BP: (!) 148/87 116/87 99/64 103/62  Pulse: (!) 115 79 69 60  Resp:      Temp:      TempSrc:      SpO2:      Weight:      Height:       FHR 120 with average variability and + accels No decelerations  Cervix 4/80/-2/vertex  AROM clear/slightly brown fluid  Will continue plan of care

## 2016-07-02 NOTE — Lactation Note (Addendum)
This note was copied from a baby's chart. Lactation Consultation Note  Patient Name: Amber Griffin BJYNW'GToday's Date: 07/02/2016 Reason for consult: Initial assessment  Initial visit at 12 hours of age.  Mom has 3 older children that she nursed for 4-6 months each.  Mom denies pain or concerns with this baby.  Mom has been having bad cramping and gave baby a bottle earlier today. LC encouraged mom to void prior to latching baby to help with cramping. MOm plans to offer some formula when baby is still hungry. LC discussed cluster feeding and supply and demand of milk volume.  Mom asked for pacifier, LC educated mom on delaying use. LC offered to assist with latch, mom declines and wants to take a shower. [facility] given and discussed.  Encouraged to feed with early cues on demand.  Early newborn behavior discussed.  Hand expression reported by mom with colostrum visible.  Mom to call for assist as needed.     Maternal Data Has patient been taught Hand Expression?: Yes Does the patient have breastfeeding experience prior to this delivery?: Yes  Feeding Feeding Type: Breast Fed Length of feed: 20 min  LATCH Score/Interventions                Intervention(s): Breastfeeding basics reviewed     Lactation Tools Discussed/Used WIC Program: Yes   Consult Status Consult Status: Follow-up Date: 07/03/16 Follow-up type: In-patient    Amber Griffin, Amber Griffin 07/02/2016, 8:58 PM

## 2016-07-02 NOTE — Anesthesia Procedure Notes (Deleted)
Epidural Patient location during procedure: OB Start time: 07/02/2016 5:24 AM  Staffing Anesthesiologist: Odette FractionHARKINS, Lujain Kraszewski Performed: anesthesiologist   Preanesthetic Checklist Completed: patient identified, site marked, surgical consent, pre-op evaluation, timeout performed, IV checked, risks and benefits discussed and monitors and equipment checked  Epidural Patient position: sitting Prep: DuraPrep Patient monitoring: heart rate, continuous pulse ox and blood pressure Approach: midline Location: L4-L5 Injection technique: LOR air and LOR saline  Needle:  Needle type: Tuohy  Needle gauge: 18 G Needle length: 9 cm Needle insertion depth: 9 cm Catheter at skin depth: 15 cm Test dose: negative and 2% lidocaine with Epi 1:200 K  Assessment Events: blood not aspirated, injection not painful, no injection resistance, negative IV test and no paresthesia

## 2016-07-02 NOTE — Progress Notes (Signed)
Patient's children visiting. Pt talkative with them, smiling a little. More eye contact now. FOB not here.

## 2016-07-02 NOTE — Anesthesia Procedure Notes (Signed)
Epidural Patient location during procedure: OB Start time: 07/02/2016 12:24 AM  Staffing Anesthesiologist: Odette FractionHARKINS, Vidyuth Belsito Performed: anesthesiologist   Preanesthetic Checklist Completed: patient identified, site marked, surgical consent, pre-op evaluation, timeout performed, IV checked, risks and benefits discussed and monitors and equipment checked  Epidural Patient position: sitting Prep: DuraPrep Approach: midline Location: L3-L4 Injection technique: LOR saline  Needle:  Needle type: Tuohy  Needle gauge: 18 G Needle length: 9 cm Needle insertion depth: 7 cm Catheter type: closed end flexible Catheter size: 20 Guage Catheter at skin depth: 12 cm Test dose: negative and 2% lidocaine with Epi 1:200 K  Assessment Sensory level: T9 Events: blood not aspirated, injection not painful, no injection resistance, negative IV test and no paresthesia

## 2016-07-02 NOTE — Progress Notes (Signed)
Patient looking sad on admission and continues to look that way at 1hr check. Seems to avoid eye contact but occasionally does look up. FOB on the phone with his mother and shoved the phone towards her wanting her to talk. Pt scowled , said "Hi", looked up at the phone briefly, then said nothing else. Asked patient if something was wrong or if there was anything I could do for her. She said in a surprisingly loud voice, "I just want to be left alone with my baby!"  Left her alone then and noticed that the FOB left shortly afterwards.

## 2016-07-02 NOTE — Progress Notes (Signed)
Patient ID: Amber Griffin, female   DOB: [birth year], 26 y.o.   MRN: [id number] Called to room for FHR decel   Had just received PCA dose of epidural BPs have been very labile all night, even prior to epidural BP was low in 80s  Also uterine contractions were frequent, with tachysystole  FHR 80s to 90s for 5-7 min Pitocin turned off Terbutaline given Ephedrine given with good response of BP  As BP rose, FHR rose  Will rest for 30 min then restart Pitocin at 722mu/min

## 2016-07-02 NOTE — Anesthesia Preprocedure Evaluation (Signed)
Anesthesia Evaluation  Patient identified by MRN, date of birth, ID band Patient awake    History of Anesthesia Complications Negative for: history of anesthetic complications  Airway Mallampati: II       Dental no notable dental hx. (+) Teeth Intact   Pulmonary neg pulmonary ROS,    Pulmonary exam normal        Cardiovascular negative cardio ROS Normal cardiovascular exam     Neuro/Psych  Headaches, negative psych ROS   GI/Hepatic negative GI ROS, Neg liver ROS,   Endo/Other  negative endocrine ROS  Renal/GU negative Renal ROS  negative genitourinary   Musculoskeletal negative musculoskeletal ROS (+)   Abdominal   Peds negative pediatric ROS (+)  Hematology negative hematology ROS (+)   Anesthesia Other Findings   Reproductive/Obstetrics (+) Pregnancy                             Anesthesia Physical Anesthesia Plan  ASA: II  Anesthesia Plan: Epidural   Post-op Pain Management:    Induction:   Airway Management Planned: Simple Face Mask  Additional Equipment:   Intra-op Plan:   Post-operative Plan:   Informed Consent: I have reviewed the patients History and Physical, chart, labs and discussed the procedure including the risks, benefits and alternatives for the proposed anesthesia with the patient or authorized representative who has indicated his/her understanding and acceptance.     Plan Discussed with:   Anesthesia Plan Comments:         Anesthesia Quick Evaluation

## 2016-07-03 MED ORDER — MEDROXYPROGESTERONE ACETATE 150 MG/ML IM SUSP: 150.0000 mg | Freq: Once | INTRAMUSCULAR | Status: DC
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
## 2016-07-03 NOTE — Anesthesia Postprocedure Evaluation (Signed)
Anesthesia Post Note  Patient: Amber Griffin  Procedure(s) Performed: * No procedures listed *  Patient location during evaluation: Mother Baby Anesthesia Type: Epidural Level of consciousness: awake Pain management: satisfactory to patient Vital Signs Assessment: post-procedure vital signs reviewed and stable Respiratory status: spontaneous breathing Cardiovascular status: stable Anesthetic complications: no        Last Vitals:  Vitals:   07/02/16 2336 07/03/16 0540  BP: 110/68 (!) 100/57  Pulse: 90 73  Resp: 18 18  Temp: 36.7 C 36.6 C    Last Pain:  Vitals:   07/03/16 0715  TempSrc:   PainSc: 0-No pain   Pain Goal: Patients Stated Pain Goal: 2 (07/02/16 1915)               Cephus ShellingBURGER,Ivadell Gaul

## 2016-07-03 NOTE — Plan of Care (Signed)
Problem: Pain Management: Goal: General experience of comfort will improve and pain level will decrease Mother complaining of uterine pain of 5-10 out of 10 and taking 2 tabs of Oxycodone IR every 4-5 hours. She complains of uterine cramping and pain at the umbilicus. Heat packs applied with some relief. Patient instructed to void often and report any changes or increase in pain.

## 2016-07-03 NOTE — Progress Notes (Signed)
Post Partum Day #1 Subjective: no complaints, up ad lib, voiding and tolerating PO  Objective: Blood pressure (!) 100/57, pulse 73, temperature 97.8 F (36.6 C), temperature source Oral, resp. rate 18, height 5\' 8"  (1.727 m), weight 92.5 kg (204 lb), last menstrual period 08/30/2015, SpO2 99 %, unknown if currently breastfeeding.  Physical Exam:  General: alert Lochia: appropriate Uterine Fundus: firm DVT Evaluation: No evidence of DVT seen on physical exam.   Recent Labs  07/01/16 2032  HGB 9.6*  HCT 28.4*    Assessment/Plan: Plan for discharge tomorrow  She will sign her MCD forms to get a BTL in 4 weeks Depo provera prior to discharge home   LOS: 2 days   Hanz [place]halter C Violia Knopf 07/03/2016, 7:30 AM

## 2016-07-03 NOTE — Progress Notes (Signed)
Post discharge chart review completed.  

## 2016-07-04 ENCOUNTER — Encounter: Payer: Self-pay | Admitting: *Deleted

## 2016-07-04 ENCOUNTER — Encounter (HOSPITAL_COMMUNITY): Payer: Self-pay | Admitting: *Deleted

## 2016-07-04 LAB — CULTURE, OB URINE: Culture: >=100000 — AB

## 2016-07-04 MED ORDER — IBUPROFEN 600 MG PO TABS: 600.0000 mg | ORAL_TABLET | Freq: Four times a day (QID) | ORAL | 0 refills | Status: DC

## 2016-07-04 MED ORDER — MEDROXYPROGESTERONE ACETATE 150 MG/ML IM SUSP
150.0000 mg | Freq: Once | INTRAMUSCULAR | Status: AC
  Administered 2016-07-04: 150 mg via INTRAMUSCULAR
  Filled 2016-07-04: qty 1

## 2016-07-04 NOTE — Progress Notes (Signed)
Pt discharged home ambulatory in stable condition.  Accompanied off unit with Carolynn Serve. Pollard, NT and significant other.  Discharge instructions previously reviewed and copy given with pt understanding verbalized.

## 2016-07-04 NOTE — Discharge Summary (Signed)
OB Discharge Summary  Patient Name: Amber Griffin DOB: 11/30/1990 MRN: [id number]  Date of admission: 07/01/2016 Delivering MD: Rolm Bookbinder   Date of discharge: 07/04/2016  Admitting diagnosis: [redacted]w[redacted]d, Mild CTX and Headache  Intrauterine pregnancy: [redacted]w[redacted]d     Secondary diagnosis:Principal Problem:   Post-dates pregnancy Active Problems:   Previous preterm delivery, antepartum   No prenatal care in current pregnancy in third trimester  Additional problems: + GBS, + THC, Hbg 9.6     Discharge diagnosis: Term Pregnancy Delivered                                                                     Augmentation: AROM and Pitocin  Complications: None  Hospital course:  Onset of Labor With Vaginal Delivery     26 y.o. yo [id number] at [redacted]w[redacted]d was admitted in Latent Labor on 07/01/2016. Patient had an uncomplicated labor course as follows:  Membrane Rupture Time/Date: 4:40 AM ,07/02/2016   Intrapartum Procedures: Episiotomy: None [1]                                         Lacerations:  None [1]  Patient had a delivery of a Viable infant. 07/02/2016  Information for the patient's newborn:  Jeanmarie Hubert Girl Mya [[id number]]  Delivery Method: Vaginal, Spontaneous Delivery (Filed from Delivery Summary)    Pateint had an uncomplicated postpartum course.  She is ambulating, tolerating a regular diet, passing flatus, and urinating well. Patient is discharged home in stable condition on 07/04/16.   Physical exam  Vitals:   07/02/16 2336 07/03/16 0540 07/03/16 1736 07/04/16 0530  BP: 110/68 (!) 100/57 (!) 102/56 113/74  Pulse: 90 73 60 73  Resp: 18 18 18 18   Temp: 98.1 F (36.7 C) 97.8 F (36.6 C) 97.7 F (36.5 C) 97.7 F (36.5 C)  TempSrc: Oral Oral Oral Oral  SpO2: 98% 99%  99%  Weight:      Height:       General: alert Lochia: appropriate Uterine Fundus: firm Incision: N/A DVT Evaluation: No evidence of DVT seen on physical exam. Labs: Lab  Results  Component Value Date   WBC 8.8 07/01/2016   HGB 9.6 (L) 07/01/2016   HCT 28.4 (L) 07/01/2016   MCV 88.2 07/01/2016   PLT 228 07/01/2016   CMP Latest Ref Rng & Units 08/14/2010  Glucose 70 - 99 mg/dL 87  BUN 6 - 23 mg/dL 11  Creatinine 1.30 - 8.65 mg/dL 7.84  Sodium 696 - 295 mEq/L 137  Potassium 3.5 - 5.1 mEq/L 3.7  Chloride 96 - 112 mEq/L 105  CO2 19 - 32 mEq/L 23  Calcium 8.4 - 10.5 mg/dL 9.4  Total Protein 6.0 - 8.3 g/dL 7.3  Total Bilirubin 0.3 - 1.2 mg/dL 2.8(U)  Alkaline Phos 39 - 117 U/L 56  AST 0 - 37 U/L 17  ALT 0 - 35 U/L 12    Discharge instruction: per After Visit Summary and "Baby and Me Booklet".  After Visit Meds:    Diet: routine diet  Activity: Advance as tolerated. Pelvic rest for 6 weeks.   Outpatient follow up:6 weeks  Follow up Appt:No future appointments. Follow up visit: No Follow-up on file.  Postpartum contraception: Depo Provera prior to discharge and then a BTL in 4 weeks  Newborn Data: Live born female  Birth Weight: 6 lb 9.8 oz (3000 g) APGAR: 9, 9  Baby Feeding: Breast and bottle Disposition:home with mother   07/04/2016 Allie BossierMyra C Berlin Mokry, MD

## 2016-07-04 NOTE — Discharge Instructions (Signed)

## 2016-07-04 NOTE — Lactation Note (Signed)
This note was copied from a baby's chart. Lactation Consultation Note  Patient Name: Amber Griffin ZOXWR'UToday's Date: 07/04/2016  Mom still c/o cramping with feedings.  Reassured this will improve over the next few days.  She continues to give formula in addition to breastfeeding.  Manual pump given with instructions.  Lactation outpatient services reviewed and encouraged prn.   Maternal Data    Feeding Feeding Type: Bottle Fed - Formula Nipple Type: Slow - flow Length of feed: 10 min  LATCH Score/Interventions Latch: Repeated attempts needed to sustain latch, nipple held in mouth throughout feeding, stimulation needed to elicit sucking reflex. Intervention(s): Adjust position;Assist with latch;Breast compression  Audible Swallowing: Spontaneous and intermittent Intervention(s): Skin to skin  Type of Nipple: Everted at rest and after stimulation  Comfort (Breast/Nipple): Filling, red/small blisters or bruises, mild/mod discomfort  Problem noted: Mild/Moderate discomfort Interventions (Mild/moderate discomfort): Hand expression  Hold (Positioning): Assistance needed to correctly position infant at breast and maintain latch.  LATCH Score: 7  Lactation Tools Discussed/Used     Consult Status      Huston FoleyMOULDEN, [name] 07/04/2016, 9:32 AM

## 2016-07-13 ENCOUNTER — Encounter (HOSPITAL_COMMUNITY): Payer: Self-pay

## 2016-09-05 ENCOUNTER — Encounter (HOSPITAL_BASED_OUTPATIENT_CLINIC_OR_DEPARTMENT_OTHER): Payer: Self-pay | Admitting: *Deleted

## 2016-09-06 ENCOUNTER — Encounter (HOSPITAL_BASED_OUTPATIENT_CLINIC_OR_DEPARTMENT_OTHER): Payer: Self-pay | Admitting: *Deleted

## 2016-09-10 ENCOUNTER — Ambulatory Visit (HOSPITAL_COMMUNITY)
Admission: EM | Admit: 2016-09-10 | Discharge: 2016-09-10 | Disposition: A | Discharge status: Home / Self Care | Payer: Medicaid Other | Attending: Family Medicine | Admitting: Family Medicine

## 2016-09-10 ENCOUNTER — Encounter (HOSPITAL_BASED_OUTPATIENT_CLINIC_OR_DEPARTMENT_OTHER): Payer: Self-pay | Admitting: Anesthesiology

## 2016-09-10 ENCOUNTER — Encounter (HOSPITAL_COMMUNITY): Payer: Self-pay | Admitting: *Deleted

## 2016-09-10 DIAGNOSIS — F411 Generalized anxiety disorder: Secondary | ICD-10-CM

## 2016-09-10 DIAGNOSIS — R0789 Other chest pain: Secondary | ICD-10-CM

## 2016-09-10 MED ORDER — ALPRAZOLAM 0.25 MG PO TABS: 0.2500 mg | ORAL_TABLET | Freq: Two times a day (BID) | ORAL | 0 refills | Status: DC | PRN

## 2016-09-10 MED ORDER — NAPROXEN 500 MG PO TABS: 500.0000 mg | ORAL_TABLET | Freq: Two times a day (BID) | ORAL | 0 refills | Status: DC

## 2016-09-10 NOTE — ED Triage Notes (Signed)
Pt  Reports   Symptoms     Of      Chest  Tightness    And  Pain      When   She  Takes   A  Deep  Breath    Pt  States   She   Has   Episodes  Of  Anxiety          And   At  This  Time  Pt  Denies  Any   Pain
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
## 2016-09-11 ENCOUNTER — Telehealth (HOSPITAL_COMMUNITY): Payer: Self-pay

## 2016-09-11 NOTE — Telephone Encounter (Signed)
Call to cancel surgery, will not reschedule without office visit, cancelled w/ less than 48hrs notice.

## 2016-09-13 ENCOUNTER — Ambulatory Visit (HOSPITAL_BASED_OUTPATIENT_CLINIC_OR_DEPARTMENT_OTHER)
Admission: RE | Admit: 2016-09-13 | Payer: Medicaid Other | Source: Ambulatory Visit | Admitting: Obstetrics & Gynecology

## 2016-09-13 ENCOUNTER — Encounter (HOSPITAL_BASED_OUTPATIENT_CLINIC_OR_DEPARTMENT_OTHER): Admission: RE | Payer: Self-pay | Source: Ambulatory Visit

## 2016-09-13 HISTORY — DX: Anxiety disorder, unspecified: F41.9

## 2016-09-13 SURGERY — LIGATION, FALLOPIAN TUBE, LAPAROSCOPIC
Anesthesia: Choice | Laterality: Bilateral

## 2016-09-19 ENCOUNTER — Ambulatory Visit: Payer: Medicaid Other | Admitting: Obstetrics and Gynecology

## 2017-09-19 ENCOUNTER — Ambulatory Visit (HOSPITAL_COMMUNITY)
Admission: EM | Admit: 2017-09-19 | Discharge: 2017-09-19 | Disposition: A | Discharge status: Home / Self Care | Payer: Self-pay

## 2017-09-19 ENCOUNTER — Other Ambulatory Visit: Payer: Self-pay

## 2017-09-19 ENCOUNTER — Encounter (HOSPITAL_COMMUNITY): Payer: Self-pay | Admitting: Emergency Medicine

## 2017-09-19 DIAGNOSIS — R51 Headache: Secondary | ICD-10-CM

## 2017-09-19 DIAGNOSIS — R519 Headache, unspecified: Secondary | ICD-10-CM

## 2017-09-19 DIAGNOSIS — R112 Nausea with vomiting, unspecified: Secondary | ICD-10-CM

## 2017-09-19 DIAGNOSIS — Z8669 Personal history of other diseases of the nervous system and sense organs: Secondary | ICD-10-CM

## 2017-09-19 LAB — POCT PREGNANCY, URINE: Preg Test, Ur: NEGATIVE

## 2017-09-19 MED ORDER — ONDANSETRON 4 MG PO TBDP
8.0000 mg | ORAL_TABLET | Freq: Once | ORAL | Status: AC
  Administered 2017-09-19: 8 mg via ORAL

## 2017-09-19 MED ORDER — ONDANSETRON 4 MG PO TBDP
ORAL_TABLET | ORAL | Status: AC
  Filled 2017-09-19: qty 2

## 2017-09-19 MED ORDER — CETIRIZINE HCL 10 MG PO TABS: 10.0000 mg | ORAL_TABLET | Freq: Every day | ORAL | 11 refills | Status: DC

## 2017-09-19 MED ORDER — ONDANSETRON 8 MG PO TBDP: 8.0000 mg | ORAL_TABLET | Freq: Three times a day (TID) | ORAL | 0 refills | Status: DC | PRN

## 2017-09-19 MED ORDER — SUMATRIPTAN SUCCINATE 50 MG PO TABS: ORAL_TABLET | ORAL | 0 refills | Status: AC

## 2017-09-19 MED ORDER — KETOROLAC TROMETHAMINE 60 MG/2ML IM SOLN
INTRAMUSCULAR | Status: AC
  Filled 2017-09-19: qty 2

## 2017-09-19 MED ORDER — KETOROLAC TROMETHAMINE 60 MG/2ML IM SOLN
60.0000 mg | Freq: Once | INTRAMUSCULAR | Status: AC
  Administered 2017-09-19: 60 mg via INTRAMUSCULAR

## 2017-09-19 MED ORDER — FLUTICASONE PROPIONATE 50 MCG/ACT NA SUSP: 2.0000 | Freq: Every day | NASAL | 1 refills | Status: DC

## 2017-09-19 NOTE — ED Triage Notes (Signed)
Pt reports a headache x2 days with no relief from [employer] or Excedrin.

## 2017-09-19 NOTE — ED Provider Notes (Signed)
MRN: [id number] DOB: 17-May-1990  Subjective:   Amber Griffin is a 27 y.o. female presenting for 2 day history of bilateral temporal headache that has been constant and not responding to Excedrin. Patient has a history of migraines, usually resolved with Excedrin. Has bilateral ear pain, inner nose pain, mild photosensitivity. Is starting to have sore throat, malaise, nausea with vomiting (multiple episodes throughout the night and this morning), belly pain. Denies history of allergies. Smokes 3 cigarettes per day. Patient is sexually active, not currently using Depo injections.   No current facility-administered medications for this encounter.   Current Outpatient Medications:  .  FLUoxetine (PROZAC) 20 MG capsule, Take 20 mg by mouth daily., Disp: , Rfl:  .  ALPRAZolam (XANAX) 0.25 MG tablet, Take 1 tablet (0.25 mg total) by mouth 2 (two) times daily as needed for anxiety., Disp: 30 tablet, Rfl: 0    Allergies  Allergen Reactions  . Other Anaphylaxis    Seafood - pt states that she is not longer allergic   . Shellfish Allergy Anaphylaxis    Throat swells and difficulty breathing, said has had since with no issues pt states that she is not longer allergic     Past Medical History:  Diagnosis Date  . Anxiety   . Chlamydia infection affecting pregnancy 02/21/2015   Treated by Dr Clearance Coots   . Migraines      Past Surgical History:  Procedure Laterality Date  . DILATION AND CURETTAGE OF UTERUS    . DILATION AND EVACUATION N/A 11/21/2012   Procedure: DILATATION AND EVACUATION;  Surgeon: Catalina Antigua, MD;  Location: [facility];  Service: Gynecology;  Laterality: N/A;  . HEMATOMA EVACUATION Right 01/11/2009   vulva    Objective:   Vitals: BP 109/68 (BP Location: Left Arm)   Pulse 100   Resp 18   LMP 08/24/2017 (Approximate)   SpO2 99%   Physical Exam  Constitutional: She is oriented to person, place, and time. She appears well-developed and well-nourished.  HENT:   Right Ear: Tympanic membrane normal.  Left Ear: Tympanic membrane normal.  Nose: No sinus tenderness.  Mouth/Throat: Oropharynx is clear and moist.  Eyes: Pupils are equal, round, and reactive to light. EOM are normal. Right eye exhibits no discharge. Left eye exhibits no discharge. No scleral icterus.  Cardiovascular: Normal rate, regular rhythm, normal heart sounds and intact distal pulses. Exam reveals no gallop and no friction rub.  No murmur heard. Pulmonary/Chest: Effort normal and breath sounds normal. No stridor. No respiratory distress. She has no wheezes. She has no rales.  Neurological: She is alert and oriented to person, place, and time.  Skin: Skin is warm and dry.  Psychiatric: She has a normal mood and affect.   Results for orders placed or performed during the hospital encounter of 09/19/17 (from the past 24 hour(s))  Pregnancy, urine POC     Status: None   Collection Time: 09/19/17  3:40 PM  Result Value Ref Range   Preg Test, Ur NEGATIVE NEGATIVE    Assessment and Plan :   Bad headache  Sinus headache  Non-intractable vomiting with nausea, unspecified vomiting type  History of migraine  Patient is to start allergy medications to address allergies as a source of her headaches. Due to her history of migraines, I offered patient Imitrex as a migraine medication. Zofran for n/v, supportive care. Counseled on management of headaches. Return-to-clinic precautions discussed, patient verbalized understanding. Patient asked me to examine her child at the end of her  visit. I counseled that I was unable to do this and recommended she check her daughter in as a patient so that we could evaluate her.      Wallis BambergMani, Naiomy Watters, [place]-C 09/19/17 [id number]

## 2018-04-02 ENCOUNTER — Ambulatory Visit: Payer: Self-pay | Admitting: Obstetrics

## 2018-04-17 ENCOUNTER — Ambulatory Visit (INDEPENDENT_AMBULATORY_CARE_PROVIDER_SITE_OTHER): Payer: Self-pay | Admitting: Advanced Practice Midwife

## 2018-04-17 ENCOUNTER — Encounter: Payer: Self-pay | Admitting: Advanced Practice Midwife

## 2018-04-17 VITALS — BP 122/83 | HR 97 | Wt 190.0 lb

## 2018-04-17 DIAGNOSIS — N739 Female pelvic inflammatory disease, unspecified: Secondary | ICD-10-CM

## 2018-04-17 DIAGNOSIS — N926 Irregular menstruation, unspecified: Secondary | ICD-10-CM

## 2018-04-17 DIAGNOSIS — Z3202 Encounter for pregnancy test, result negative: Secondary | ICD-10-CM

## 2018-04-17 DIAGNOSIS — R102 Pelvic and perineal pain: Secondary | ICD-10-CM

## 2018-04-17 MED ORDER — LIDOCAINE HCL 2 % EX GEL: 1.0000 "application " | CUTANEOUS | 0 refills | Status: AC | PRN

## 2018-04-17 MED ORDER — TRAMADOL HCL 50 MG PO TABS: 50.0000 mg | ORAL_TABLET | Freq: Four times a day (QID) | ORAL | 0 refills | Status: AC | PRN

## 2018-04-17 NOTE — Patient Instructions (Signed)
Skin Abscess  A skin abscess is an infected area on or under your skin that contains a collection of pus and other material. An abscess may also be called a furuncle, carbuncle, or boil. An abscess can occur in or on almost any part of your body. Some abscesses break open (rupture) on their own. Most continue to get worse unless they are treated. The infection can spread deeper into the body and eventually into your blood, which can make you feel ill. Treatment usually involves draining the abscess. What are the causes? An abscess occurs when germs, like bacteria, pass through your skin and cause an infection. This may be caused by:  A scrape or cut on your skin.  A puncture wound through your skin, including a needle injection or insect bite.  Blocked oil or sweat glands.  Blocked and infected hair follicles.  A cyst that forms beneath your skin (sebaceous cyst) and becomes infected. What increases the risk? This condition is more likely to develop in people who:  Have a weak body defense system (immune system).  Have diabetes.  Have dry and irritated skin.  Get frequent injections or use illegal IV drugs.  Have a foreign body in a wound, such as a splinter.  Have problems with their lymph system or veins. What are the signs or symptoms? Symptoms of this condition include:  A painful, firm bump under the skin.  A bump with pus at the top. This may break through the skin and drain. Other symptoms include:  Redness surrounding the abscess site.  Warmth.  Swelling of the lymph nodes (glands) near the abscess.  Tenderness.  A sore on the skin. How is this diagnosed? This condition may be diagnosed based on:  A physical exam.  Your medical history.  A sample of pus. This may be used to find out what is causing the infection.  Blood tests.  Imaging tests, such as an ultrasound, CT scan, or MRI. How is this treated? A small abscess that drains on its own may not  need treatment. Treatment for larger abscesses may include:  Moist heat or heat pack applied to the area several times a day.  A procedure to drain the abscess (incision and drainage).  Antibiotic medicines. For a severe abscess, you may first get antibiotics through an IV and then change to antibiotics by mouth. Follow these instructions at home: Medicines   Take over-the-counter and prescription medicines only as told by your health care provider.  If you were prescribed an antibiotic medicine, take it as told by your health care provider. Do not stop taking the antibiotic even if you start to feel better. Abscess care   If you have an abscess that has not drained, apply heat to the affected area. Use the heat source that your health care provider recommends, such as a moist heat pack or a heating pad. ? Place a towel between your skin and the heat source. ? Leave the heat on for 20-30 minutes. ? Remove the heat if your skin turns bright red. This is especially important if you are unable to feel pain, heat, or cold. You may have a greater risk of getting burned.  Follow instructions from your health care provider about how to take care of your abscess. Make sure you: ? Cover the abscess with a bandage (dressing). ? Change your dressing or gauze as told by your health care provider. ? Wash your hands with soap and water before you change the   dressing or gauze. If soap and water are not available, use hand sanitizer.  Check your abscess every day for signs of a worsening infection. Check for: ? More redness, swelling, or pain. ? More fluid or blood. ? Warmth. ? More pus or a bad smell. General instructions  To avoid spreading the infection: ? Do not share personal care items, towels, or hot tubs with others. ? Avoid making skin contact with other people.  Keep all follow-up visits as told by your health care provider. This is important. Contact a health care provider if you  have:  More redness, swelling, or pain around your abscess.  More fluid or blood coming from your abscess.  Warm skin around your abscess.  More pus or a bad smell coming from your abscess.  A fever.  Muscle aches.  Chills or a general ill feeling. Get help right away if you:  Have severe pain.  See red streaks on your skin spreading away from the abscess. Summary  A skin abscess is an infected area on or under your skin that contains a collection of pus and other material.  A small abscess that drains on its own may not need treatment.  Treatment for larger abscesses may include having a procedure to drain the abscess and taking an antibiotic. This information is not intended to replace advice given to you by your health care provider. Make sure you discuss any questions you have with your health care provider. Document Released: 11/16/2004 Document Revised: 03/22/2017 Document Reviewed: 03/22/2017 Elsevier Interactive Patient Education  2019 Elsevier Inc.  

## 2018-04-17 NOTE — Progress Notes (Signed)
  GYNECOLOGY PROGRESS NOTE  History:  28 y.o. Amber Griffin presents to [facility] office today for problem gyn visit. She reports after masturbation today she had severe vaginal pain and noticed a bump and swelling near her clitoris.  The pain and swelling are increasing. She has had this happen before but not this severe.  Her period is also 2-3 days late so she desires a pregnancy test.  She denies h/a, dizziness, shortness of breath, n/v, or fever/chills.    The following portions of the patient's history were reviewed and updated as appropriate: allergies, current medications, past family history, past medical history, past social history, past surgical history and problem list. Last pap smear on 04/19/15 was normal.  Review of Systems:  Pertinent items are noted in HPI.   Objective:  Physical Exam Blood pressure 122/83, pulse 97, weight 86.2 kg, currently breastfeeding. VS reviewed, nursing note reviewed,  Constitutional: well developed, well nourished, no distress HEENT: normocephalic CV: normal rate Pulm/chest wall: normal effort Breast Exam: deferred Abdomen: soft Neuro: alert and oriented x 3 Skin: warm, dry Psych: affect normal Pelvic exam: Cervix pink, visually closed, without lesion, scant white creamy discharge, vaginal walls and external genitalia normal Bimanual exam: Cervix 0/long/high, firm, anterior, neg CMT, uterus nontender, nonenlarged, adnexa without tenderness, enlargement, or mass  Assessment & Plan:  1. Vaginal pain   2. Missed menses --Pregnancy test negative  3. Pregnancy test negative   4. Abscess of female genitalia --Small, 0.5 cm in diameter painful smooth round cyst/abscess near clitoris.  No vaginal discharge, no skin lesion.   --Pt reports using lubricant with masturbation but no other products/devices.   --No erythema surrounding abscess, no evidence of allergic reaction --Hurricane spray used in office, Rx for Lidocaine jelly and Tramadol x 10 tabs.   --F/U PRN if does not improve or if returns.   Sharen Counter, CNM 6:48 PM

## 2018-04-18 LAB — POCT PREGNANCY, URINE: Preg Test, Ur: NEGATIVE

## 2018-10-16 ENCOUNTER — Ambulatory Visit: Payer: Self-pay | Admitting: Obstetrics & Gynecology

## 2020-05-05 ENCOUNTER — Encounter (HOSPITAL_COMMUNITY): Payer: Self-pay | Admitting: Emergency Medicine

## 2020-05-05 ENCOUNTER — Emergency Department (HOSPITAL_COMMUNITY): Payer: Self-pay

## 2020-05-05 ENCOUNTER — Emergency Department (HOSPITAL_COMMUNITY)
Admission: EM | Admit: 2020-05-05 | Discharge: 2020-05-05 | Disposition: A | Discharge status: Home / Self Care | Payer: Self-pay | Attending: Emergency Medicine | Admitting: Emergency Medicine

## 2020-05-05 ENCOUNTER — Other Ambulatory Visit: Payer: Self-pay

## 2020-05-05 DIAGNOSIS — Z87891 Personal history of nicotine dependence: Secondary | ICD-10-CM | POA: Insufficient documentation

## 2020-05-05 DIAGNOSIS — K047 Periapical abscess without sinus: Secondary | ICD-10-CM | POA: Insufficient documentation

## 2020-05-05 DIAGNOSIS — X58XXXA Exposure to other specified factors, initial encounter: Secondary | ICD-10-CM | POA: Insufficient documentation

## 2020-05-05 DIAGNOSIS — T189XXA Foreign body of alimentary tract, part unspecified, initial encounter: Secondary | ICD-10-CM | POA: Insufficient documentation

## 2020-05-05 LAB — PREGNANCY, URINE: Preg Test, Ur: NEGATIVE

## 2020-05-05 MED ORDER — AMOXICILLIN-POT CLAVULANATE 875-125 MG PO TABS: 1.0000 | ORAL_TABLET | Freq: Two times a day (BID) | ORAL | 0 refills | Status: DC

## 2020-05-05 MED ORDER — AMOXICILLIN-POT CLAVULANATE 875-125 MG PO TABS
1.0000 | ORAL_TABLET | Freq: Once | ORAL | Status: AC
  Administered 2020-05-05: 1 via ORAL
  Filled 2020-05-05: qty 1

## 2020-05-05 MED ORDER — IBUPROFEN 800 MG PO TABS
800.0000 mg | ORAL_TABLET | Freq: Once | ORAL | Status: AC
  Administered 2020-05-05: 800 mg via ORAL
  Filled 2020-05-05: qty 1

## 2020-05-05 NOTE — Discharge Instructions (Addendum)
Please take the antibiotic as prescribed.  Please follow-up with your dentist if you do not have a dentist please follow-up with Amber Griffin   Your xrays were without evidence of metal foreign body  Please use Tylenol or ibuprofen for pain.  You may use 600 mg ibuprofen every 6 hours or 1000 mg of Tylenol every 6 hours.  You may choose to alternate between the 2.  This would be most effective.  Not to exceed 4 g of Tylenol within 24 hours.  Not to exceed 3200 mg ibuprofen 24 hours.
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
## 2020-05-05 NOTE — ED Triage Notes (Signed)
Patient complains of swelling to lower left jaw that she first noticed last night. Patient alert, oriented, and in no apparent distress at this time.

## 2020-05-05 NOTE — ED Notes (Signed)
Patient transported to X-ray 

## 2020-06-01 ENCOUNTER — Other Ambulatory Visit: Payer: Self-pay

## 2020-06-01 ENCOUNTER — Emergency Department (HOSPITAL_COMMUNITY)
Admission: EM | Admit: 2020-06-01 | Discharge: 2020-06-01 | Disposition: A | Discharge status: Home / Self Care | Payer: Self-pay | Attending: Emergency Medicine | Admitting: Emergency Medicine

## 2020-06-01 ENCOUNTER — Encounter (HOSPITAL_COMMUNITY): Payer: Self-pay

## 2020-06-01 ENCOUNTER — Emergency Department (HOSPITAL_COMMUNITY): Payer: Self-pay

## 2020-06-01 DIAGNOSIS — K047 Periapical abscess without sinus: Secondary | ICD-10-CM | POA: Insufficient documentation

## 2020-06-01 DIAGNOSIS — Z87891 Personal history of nicotine dependence: Secondary | ICD-10-CM | POA: Insufficient documentation

## 2020-06-01 LAB — CBC WITH DIFFERENTIAL/PLATELET
Abs Immature Granulocytes: 0.03 10*3/uL (ref 0.00–0.07)
Basophils Absolute: 0 10*3/uL (ref 0.0–0.1)
Basophils Relative: 0 %
Eosinophils Absolute: 0 10*3/uL (ref 0.0–0.5)
Eosinophils Relative: 0 %
HCT: 40.4 % (ref 36.0–46.0)
Hemoglobin: 13.9 g/dL (ref 12.0–15.0)
Immature Granulocytes: 0 %
Lymphocytes Relative: 11 %
Lymphs Abs: 1.1 10*3/uL (ref 0.7–4.0)
MCH: 32.9 pg (ref 26.0–34.0)
MCHC: 34.4 g/dL (ref 30.0–36.0)
MCV: 95.7 fL (ref 80.0–100.0)
Monocytes Absolute: 1 10*3/uL (ref 0.1–1.0)
Monocytes Relative: 9 %
Neutro Abs: 8.6 10*3/uL — ABNORMAL HIGH (ref 1.7–7.7)
Neutrophils Relative %: 80 %
Platelets: 191 10*3/uL (ref 150–400)
RBC: 4.22 MIL/uL (ref 3.87–5.11)
RDW: 13.2 % (ref 11.5–15.5)
WBC: 10.8 10*3/uL — ABNORMAL HIGH (ref 4.0–10.5)
nRBC: 0 % (ref 0.0–0.2)

## 2020-06-01 LAB — BASIC METABOLIC PANEL
Anion gap: 10 (ref 5–15)
BUN: 6 mg/dL (ref 6–20)
CO2: 23 mmol/L (ref 22–32)
Calcium: 9.4 mg/dL (ref 8.9–10.3)
Chloride: 104 mmol/L (ref 98–111)
Creatinine, Ser: 0.7 mg/dL (ref 0.44–1.00)
GFR, Estimated: >60 mL/min (ref >60)
Glucose, Bld: 92 mg/dL (ref 70–99)
Potassium: 3.5 mmol/L (ref 3.5–5.1)
Sodium: 137 mmol/L (ref 135–145)

## 2020-06-01 MED ORDER — CLINDAMYCIN PHOSPHATE 900 MG/50ML IV SOLN
900.0000 mg | Freq: Once | INTRAVENOUS | Status: AC
  Administered 2020-06-01: 900 mg via INTRAVENOUS
  Filled 2020-06-01: qty 50

## 2020-06-01 MED ORDER — CLINDAMYCIN HCL 150 MG PO CAPS: 450.0000 mg | ORAL_CAPSULE | Freq: Three times a day (TID) | ORAL | 0 refills | Status: AC

## 2020-06-01 MED ORDER — IOHEXOL 300 MG/ML  SOLN
100.0000 mL | Freq: Once | INTRAMUSCULAR | Status: AC | PRN
  Administered 2020-06-01: 75 mL via INTRAVENOUS

## 2020-06-01 NOTE — ED Triage Notes (Signed)
Pt BIB EMS for dental pain. Pt was seen at [facility] ED 2 weeks ago for abscess in mouth. Pt was given antibiotics that she states she was taking them and did not finish entire regimen. Pt has swelling to left side of mouth. Pt states she needs more antibiotics.

## 2020-06-01 NOTE — Discharge Instructions (Addendum)
Take 4 over the counter ibuprofen tablets 3 times a day or 2 over-the-counter naproxen tablets twice a day for pain. Also take tylenol 1000mg (2 extra strength) four times a day.  Your CT scan did not show a pocket of infection that needs surgery. Take you antibiotics as prescribed  Follow up with a dentist!!
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
## 2020-06-01 NOTE — ED Notes (Signed)
An After Visit Summary was printed and given to the patient. Discharge instructions given and no further questions at this time.  

## 2021-06-13 ENCOUNTER — Other Ambulatory Visit: Payer: Self-pay

## 2021-06-13 ENCOUNTER — Emergency Department (HOSPITAL_COMMUNITY): Payer: Self-pay

## 2021-06-13 ENCOUNTER — Emergency Department (HOSPITAL_COMMUNITY)
Admission: EM | Admit: 2021-06-13 | Discharge: 2021-06-13 | Disposition: A | Discharge status: Home / Self Care | Payer: Self-pay | Attending: Emergency Medicine | Admitting: Emergency Medicine

## 2021-06-13 DIAGNOSIS — R198 Other specified symptoms and signs involving the digestive system and abdomen: Secondary | ICD-10-CM

## 2021-06-13 DIAGNOSIS — Z03821 Encounter for observation for suspected ingested foreign body ruled out: Secondary | ICD-10-CM | POA: Insufficient documentation

## 2021-06-13 NOTE — ED Provider Notes (Signed)
?[facility] EMERGENCY DEPARTMENT ?Provider Note ? ? ?CSN: [id number] ?Arrival date & time: 06/13/21  1218 ? ?  ? ?History ? ?Chief Complaint  ?Patient presents with  ? Foreign Body  ? ? ?Amber Griffin is a 31 y.o. female. ? ? ?Foreign Body ? ?Patient is a 31 year old female with history of anxiety who presents to the emergency department due to concern for swallowing her dilated ring.  She reported the incident might have happened 3 days ago.  She started feeling something stuck in her throat for the past 2 days.  She has been tolerating food without any problem.  No episode of emesis.  She denies any blood or fever.  She is not sure if she swallowed it but she cannot find her diamond ring anywhere else.  Denies any other symptoms. ? ?Home Medications ?Prior to Admission medications   ?Medication Sig Start Date End Date Taking? Authorizing Provider  ?FLUoxetine (PROZAC) 20 MG capsule Take 20 mg by mouth daily.    [provider]  ?lidocaine (XYLOCAINE) 2 % jelly Apply 1 application topically as needed. 04/17/18   Leftwich-Kirby, Wilmer Floor, CNM  ?SUMAtriptan (IMITREX) 50 MG tablet Take one tablet at the onset of a migraine. If your headache persists, then take another tablet. Do not repeat for 24 hours. ?Patient not taking: Reported on 04/17/2018 09/19/17   Wallis Bamberg, PA-C  ?traMADol (ULTRAM) 50 MG tablet Take 1 tablet (50 mg total) by mouth every 6 (six) hours as needed. 04/17/18   Leftwich-Kirby, Wilmer Floor, CNM  ?cetirizine (ZYRTEC ALLERGY) 10 MG tablet Take 1 tablet (10 mg total) by mouth daily. ?Patient not taking: Reported on 04/17/2018 09/19/17 05/05/20  Wallis Bamberg, PA-C  ?fluticasone [facility]) 50 MCG/ACT nasal spray Place 2 sprays into both nostrils daily. ?Patient not taking: Reported on 04/17/2018 09/19/17 05/05/20  Wallis Bamberg, PA-C  ?   ? ?Allergies    ?Other and Shellfish allergy   ? ?Review of Systems   ?Review of Systems ? ?Physical Exam ?Updated Vital Signs ?BP 101/76 (BP  Location: Right Arm)   Pulse 84   Temp 98.4 ?F (36.9 ?C)   Resp 17   SpO2 99%  ?Physical Exam ?Vitals and nursing note reviewed.  ?Constitutional:   ?   General: She is not in acute distress. ?   Appearance: She is well-developed. She is not ill-appearing.  ?HENT:  ?   Head: Normocephalic and atraumatic.  ?   Nose: Nose normal. No congestion.  ?   Comments: No visible foreign body ?   Mouth/Throat:  ?   Mouth: Mucous membranes are moist.  ?   Pharynx: Oropharynx is clear. No oropharyngeal exudate.  ?Eyes:  ?   Conjunctiva/sclera: Conjunctivae normal.  ?Cardiovascular:  ?   Rate and Rhythm: Normal rate and regular rhythm.  ?   Heart sounds: No murmur heard. ?Pulmonary:  ?   Effort: Pulmonary effort is normal. No respiratory distress.  ?   Breath sounds: Normal breath sounds.  ?Abdominal:  ?   Palpations: Abdomen is soft.  ?   Tenderness: There is no abdominal tenderness.  ?Musculoskeletal:     ?   General: No swelling.  ?   Cervical back: Neck supple.  ?Skin: ?   General: Skin is warm and dry.  ?   Capillary Refill: Capillary refill takes less than 2 seconds.  ?Neurological:  ?   Mental Status: She is alert.  ?Psychiatric:     ?   Mood and  Affect: Mood normal.  ? ? ?ED Results / Procedures / Treatments   ?Labs ?(all labs ordered are listed, but only abnormal results are displayed) ?Labs Reviewed - No data to display ? ?EKG ?EKG Interpretation ? ?Date/Time:  Monday June 13 2021 12:33:54 EDT ?Ventricular Rate:  82 ?PR Interval:  152 ?QRS Duration: 70 ?QT Interval:  348 ?QTC Calculation: 406 ?R Axis:   66 ?Text Interpretation: Normal sinus rhythm Normal ECG No previous ECGs available Confirmed by Richardean Canal [phone]) on 06/13/2021 3:52:04 PM ? ?Radiology ?DG Neck Soft Tissue ? ?Result Date: 06/13/2021 ?CLINICAL DATA:  Concern for metallic foreign body. EXAM: NECK SOFT TISSUES - 1+ VIEW COMPARISON:  None. FINDINGS: There is no evidence of retropharyngeal soft tissue swelling or epiglottic enlargement. The cervical  airway is unremarkable and no radio-opaque foreign body identified. No radiopaque foreign body. IMPRESSION: 1. No radiopaque foreign body in the neck soft tissues. Electronically Signed   By: Elige Ko M.D.   On: 06/13/2021 13:18   ? ?Procedures ?Procedures  ? ?Medications Ordered in ED ?Medications - No data to display ? ?ED Course/ Medical Decision Making/ A&P ?  ?                        ?Medical Decision Making ?Problems Addressed: ?Sensation of foreign body in esophagus: acute illness or injury that poses a threat to life or bodily functions ? ?Amount and/or Complexity of Data Reviewed ?Radiology: ordered and independent interpretation performed. Decision-making details documented in ED Course. ?ECG/medicine tests: ordered and independent interpretation performed. Decision-making details documented in ED Course. ? ? ?Patient is a 31 year old female present to the emergency department due to concern for body sensation in throat.  Patient vital signs are within the reference range.  She is not hypoxic or in any kind of respiratory distress.  She is protecting her airway without any trismus.  Lung sounds are clear bilaterally. ? ?Soft tissue imaging of the neck did not show  foreign body in the neck soft tissues.  Patient EKG was sinus rhythm normal sinus rhythm without any ischemic etiology. ? ?Based on her reassuring exam, I believe patient is stable for discharge.  Strict return patient has been discussed.  I have low suspicion for foreign body in her throat/esophagus or lungs/trachea. ? ?Patient did leave prior to getting her discharge paperwork. ? ? ? ?Final Clinical Impression(s) / ED Diagnoses ?Final diagnoses:  ?Sensation of foreign body in esophagus  ? ? ?Rx / DC Orders ?ED Discharge Orders   ? ? None  ? ?  ? ? ?  ?Jari Sportsman, MD ?06/13/21 1601 ? ?  ?Charlynne Pander, MD ?06/13/21 2313 ? ?

## 2021-06-13 NOTE — ED Triage Notes (Signed)
Patient coming from home, complaint of possible earing backing in nose. VSS. NAD. ?

## 2021-06-13 NOTE — ED Provider Triage Note (Signed)
Emergency Medicine Provider Triage Evaluation Note ? ?Amber Griffin , a 31 y.o. female  was evaluated in triage.  Pt complains of foreign body in her nose/throat.  Patient states that she believes a rubber backing from her left earring ended up in her nose.  Patient states that she was out drinking and then woke up the next morning and could not find the backing.  Patient is convinced that the backing ended up in her nose.  Patient feels like the backing is moving between her nose and throat causing discomfort. ? ?Review of Systems  ?Positive:  ?Negative: Trouble swallowing, trouble breathing, ? ?Physical Exam  ?BP 101/76 (BP Location: Right Arm)   Pulse 84   Temp 98.4 ?F (36.9 ?C)   Resp 17   SpO2 99%  ?Gen:   Awake, no distress   ?Resp:  Normal effort  ?MSK:   Moves extremities without difficulty  ?Other:  Left turbinates are swollen and erythematous.  Patient speaks in focally sentences without difficulty.  Handles oral secretions without difficulty. ? ?Medical Decision Making  ?Medically screening exam initiated at 12:35 PM.  Appropriate orders placed.  Amber Griffin was informed that the remainder of the evaluation will be completed by another provider, this initial triage assessment does not replace that evaluation, and the importance of remaining in the ED until their evaluation is complete. ? ? ?  ?Haskel Schroeder, PA-C ?06/13/21 1236 ? ?

## 2021-10-02 ENCOUNTER — Other Ambulatory Visit: Payer: Self-pay

## 2021-10-02 ENCOUNTER — Emergency Department (HOSPITAL_COMMUNITY)
Admission: EM | Admit: 2021-10-02 | Discharge: 2021-10-02 | Disposition: A | Discharge status: Home / Self Care | Payer: Self-pay | Attending: Emergency Medicine | Admitting: Emergency Medicine

## 2021-10-02 ENCOUNTER — Emergency Department (HOSPITAL_COMMUNITY): Payer: Self-pay

## 2021-10-02 ENCOUNTER — Encounter (HOSPITAL_COMMUNITY): Payer: Self-pay

## 2021-10-02 DIAGNOSIS — N9489 Other specified conditions associated with female genital organs and menstrual cycle: Secondary | ICD-10-CM | POA: Insufficient documentation

## 2021-10-02 DIAGNOSIS — R102 Pelvic and perineal pain: Secondary | ICD-10-CM | POA: Insufficient documentation

## 2021-10-02 LAB — BASIC METABOLIC PANEL
Anion gap: 6 (ref 5–15)
BUN: 6 mg/dL (ref 6–20)
CO2: 23 mmol/L (ref 22–32)
Calcium: 8.7 mg/dL — ABNORMAL LOW (ref 8.9–10.3)
Chloride: 109 mmol/L (ref 98–111)
Creatinine, Ser: 0.73 mg/dL (ref 0.44–1.00)
GFR, Estimated: >60 mL/min (ref >60)
Glucose, Bld: 94 mg/dL (ref 70–99)
Potassium: 3.4 mmol/L — ABNORMAL LOW (ref 3.5–5.1)
Sodium: 138 mmol/L (ref 135–145)

## 2021-10-02 LAB — I-STAT BETA HCG BLOOD, ED (MC, WL, AP ONLY): I-stat hCG, quantitative: <5 m[IU]/mL (ref <5)

## 2021-10-02 MED ORDER — IOHEXOL 300 MG/ML  SOLN
100.0000 mL | Freq: Once | INTRAMUSCULAR | Status: AC | PRN
  Administered 2021-10-02: 100 mL via INTRAVENOUS

## 2021-10-02 NOTE — ED Provider Notes (Signed)
Emergency Department Provider Note   I have reviewed the triage vital signs and the nursing notes.   HISTORY  Chief Complaint Foreign Body in Vagina (Pt states "I have a tampon stuck in my uterus."/)   HPI Amber Griffin is a 31 y.o. female presents to the emergency department by EMS with pelvic pain.  Patient states that she placed a tampon 2 hours prior to arrival in the emergency department and feels like it is stuck.  She tried to retrieve it but could not.  She tells me that she can feel it expanding and has concerned that it is stuck in her uterus.  She has mild vaginal bleeding consistent with her menstrual cycle.  No fevers.  No discharge.   Past Medical History:  Diagnosis Date   Anxiety    Chlamydia infection affecting pregnancy 02/21/2015   Treated by Dr Clearance Coots    Migraines     Review of Systems  Constitutional: No fever/chills Cardiovascular: Denies chest pain. Respiratory: Denies shortness of breath. Gastrointestinal: No abdominal pain.  No nausea, no vomiting.  No diarrhea.  No constipation. Genitourinary: Negative for dysuria. Positive pelvic pain and concern for retained tampon.  Musculoskeletal: Negative for back pain. Skin: Negative for rash. Neurological: Negative for headaches.   ____________________________________________   PHYSICAL EXAM:  VITAL SIGNS: ED Triage Vitals  Enc Vitals Group     BP 10/02/21 1238 116/79     Pulse Rate 10/02/21 1238 89     Resp 10/02/21 1238 18     Temp 10/02/21 1238 97.9 F (36.6 C)     Temp Source 10/02/21 1238 Tympanic     SpO2 10/02/21 1238 100 %     Weight 10/02/21 1235 170 lb (77.1 kg)     Height 10/02/21 1235 5\' 8"  (1.727 m)   Constitutional: Alert and oriented. Patient is tearful on my initial evaluation.  Eyes: Conjunctivae are normal.  Head: Atraumatic. Nose: No congestion/rhinnorhea. Mouth/Throat: Mucous membranes are moist.   Neck: No stridor.   Cardiovascular: Normal rate, regular  rhythm. Good peripheral circulation. Grossly normal heart sounds.   Respiratory: Normal respiratory effort.  No retractions. Lungs CTAB. Gastrointestinal: Soft and nontender. No distention.  Genitourinary: Exam performed with patient verbal consent and nurse chaperone.  Normal external genitalia.  Mild, dark blood in the vaginal vault.  Unable to clearly visualize the cervical os which is closed.  I do not appreciate any vaginal foreign body.  Bimanual exam performed and I do not palpate any foreign body or other abnormality. No CMT.  Musculoskeletal: No gross deformities of extremities. Neurologic:  Normal speech and language.  Skin:  Skin is warm, dry and intact. No rash noted.  ____________________________________________   LABS (all labs ordered are listed, but only abnormal results are displayed)  Labs Reviewed  BASIC METABOLIC PANEL - Abnormal; Notable for the following components:      Result Value   Potassium 3.4 (*)    Calcium 8.7 (*)    All other components within normal limits  I-STAT BETA HCG BLOOD, ED ([facility], [facility], [facility] ONLY)   ____________________________________________  RADIOLOGY  CT PELVIS W CONTRAST  Result Date: 10/02/2021 CLINICAL DATA:  Lost tampon.  Evaluate for foreign body. EXAM: CT PELVIS WITH CONTRAST TECHNIQUE: Multidetector CT imaging of the pelvis was performed using the standard protocol following the bolus administration of intravenous contrast. RADIATION DOSE REDUCTION: This exam was performed according to the departmental dose-optimization program which includes automated exposure control, adjustment of the mA and/or  kV according to patient size and/or use of iterative reconstruction technique. CONTRAST:  OMNIPAQUE IOHEXOL 300 MG/ML  SOLN COMPARISON:  None Available. FINDINGS: Lower Urinary Tract: Unremarkable urinary bladder. Bowel: Moderate amount of gas is seen within the rectum, however pelvic bowel loops are otherwise unremarkable. Vascular/Lymphatic:  No pathologically enlarged lymph nodes or other significant abnormality. Reproductive: Normal appearance of uterus. Adnexal regions are unremarkable. Vagina is unremarkable in appearance, and no radiopaque foreign body is identified. Other: None. Musculoskeletal: No suspicious bone lesions identified. IMPRESSION: No acute findings or other significant abnormality. No evidence of foreign body within the vagina. Electronically Signed   By: Danae Orleans M.D.   On: 10/02/2021 16:03    ____________________________________________   PROCEDURES  Procedure(s) performed:   Procedures  None ____________________________________________   INITIAL IMPRESSION / ASSESSMENT AND PLAN / ED COURSE  Pertinent labs & imaging results that were available during my care of the patient were reviewed by me and considered in my medical decision making (see chart for details).   This patient is Presenting for Evaluation of pelvic pain, which does require a range of treatment options, and is a complaint that involves a high risk of morbidity and mortality.  The Differential Diagnoses  includes but is not exclusive to ectopic pregnancy, ovarian cyst, ovarian torsion, acute appendicitis, urinary tract infection, endometriosis, bowel obstruction, hernia, colitis, renal colic, gastroenteritis, volvulus etc.   Critical Interventions-    Medications  iohexol (OMNIPAQUE) 300 MG/ML solution 100 mL (100 mLs Intravenous Contrast Given 10/02/21 1517)    Reassessment after intervention: Symptoms improved.   I decided to review pertinent External Data, and in summary patient seen in the emergency department and in April of this year with concern for swallowed foreign body.  No clear foreign body found on imaging at that time.   Clinical Laboratory Tests Ordered, included normal creatinine.  Pregnancy negative.  Radiologic Tests Ordered, included CT pelvis. I independently interpreted the images and agree with radiology  interpretation.   Cardiac Monitor Tracing which shows NSR.   Social Determinants of Health Risk patient is a non-smoker.   Medical Decision Making: Summary:  Patient presents to the emergency department for evaluation of concern for tampon in the vagina.  On exam I am unable to visualize any foreign body in the vaginal vault.  The cervix is normal-appearing.  On bimanual exam I am unable to appreciate any abnormality.  CT imaging obtained.   Reevaluation with update and discussion with patient. CT without evidence of FB which correlates with my exam. In chart review patient has had suspected FB evaluations in the ED previously. Patient has an Estate agent. She plans to call in the AM for appointment ASAP, ideally in 48 hours or return to the ED for re-evaluation. She is calm, cooperative, and appears well/stable for discharge.   Disposition: discharge  ____________________________________________  FINAL CLINICAL IMPRESSION(S) / ED DIAGNOSES  Final diagnoses:  Pelvic pain     Note:  This document was prepared using Dragon voice recognition software and may include unintentional dictation errors.  Alona Bene, MD, [facility] Emergency Medicine    Pollie Poma, Arlyss Repress, MD 10/02/21 [phone]

## 2021-10-02 NOTE — ED Triage Notes (Signed)
Pt arrived via [facility] for foreign body in vagina. Pt c/o "I have my tampax stuck in my uterus it is causing pain and lower abdominal pain because I can feel it expanding." VS stable for EMS. Patient gowned and prepped for ED provider.

## 2021-10-02 NOTE — Discharge Instructions (Signed)
You were seen in the emergency room today with pelvic discomfort.  I was unable to find any vaginal foreign body and your CT scan looks normal.  I would like for you to call your OB/GYN first thing tomorrow morning to see if they can fit you in for an exam tomorrow for a second look.  If you develop worsening pain, bleeding, fever you need to return to the emergency department immediately.

## 2022-05-02 ENCOUNTER — Other Ambulatory Visit (HOSPITAL_BASED_OUTPATIENT_CLINIC_OR_DEPARTMENT_OTHER): Payer: Self-pay

## 2022-05-02 ENCOUNTER — Emergency Department (HOSPITAL_BASED_OUTPATIENT_CLINIC_OR_DEPARTMENT_OTHER)
Admission: EM | Admit: 2022-05-02 | Discharge: 2022-05-02 | Disposition: A | Discharge status: Home / Self Care | Payer: No Typology Code available for payment source | Attending: Emergency Medicine | Admitting: Emergency Medicine

## 2022-05-02 ENCOUNTER — Encounter (HOSPITAL_BASED_OUTPATIENT_CLINIC_OR_DEPARTMENT_OTHER): Payer: Self-pay

## 2022-05-02 ENCOUNTER — Other Ambulatory Visit: Payer: Self-pay

## 2022-05-02 DIAGNOSIS — M25561 Pain in right knee: Secondary | ICD-10-CM | POA: Insufficient documentation

## 2022-05-02 DIAGNOSIS — M79642 Pain in left hand: Secondary | ICD-10-CM | POA: Diagnosis not present

## 2022-05-02 DIAGNOSIS — Y92410 Unspecified street and highway as the place of occurrence of the external cause: Secondary | ICD-10-CM | POA: Insufficient documentation

## 2022-05-02 DIAGNOSIS — M25521 Pain in right elbow: Secondary | ICD-10-CM | POA: Diagnosis not present

## 2022-05-02 DIAGNOSIS — M25532 Pain in left wrist: Secondary | ICD-10-CM | POA: Diagnosis not present

## 2022-05-02 DIAGNOSIS — M25531 Pain in right wrist: Secondary | ICD-10-CM | POA: Insufficient documentation

## 2022-05-02 DIAGNOSIS — M25522 Pain in left elbow: Secondary | ICD-10-CM | POA: Insufficient documentation

## 2022-05-02 DIAGNOSIS — M79641 Pain in right hand: Secondary | ICD-10-CM | POA: Diagnosis present

## 2022-05-02 DIAGNOSIS — M25572 Pain in left ankle and joints of left foot: Secondary | ICD-10-CM | POA: Diagnosis not present
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
## 2022-05-02 NOTE — Discharge Instructions (Addendum)
Please take tylenol/ibuprofen for pain. I recommend close follow-up with PCP for reevaluation.  Please do not hesitate to return to emergency department if worrisome signs symptoms we discussed become apparent.  
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
## 2022-08-26 ENCOUNTER — Emergency Department (HOSPITAL_COMMUNITY)
Admission: EM | Admit: 2022-08-26 | Discharge: 2022-08-26 | Disposition: A | Discharge status: Home / Self Care | Payer: Self-pay | Attending: Emergency Medicine | Admitting: Emergency Medicine

## 2022-08-26 ENCOUNTER — Encounter (HOSPITAL_COMMUNITY): Payer: Self-pay | Admitting: Emergency Medicine

## 2022-08-26 ENCOUNTER — Other Ambulatory Visit: Payer: Self-pay

## 2022-08-26 DIAGNOSIS — S80812A Abrasion, left lower leg, initial encounter: Secondary | ICD-10-CM | POA: Insufficient documentation

## 2022-08-26 DIAGNOSIS — Z23 Encounter for immunization: Secondary | ICD-10-CM | POA: Insufficient documentation

## 2022-08-26 DIAGNOSIS — W540XXA Bitten by dog, initial encounter: Secondary | ICD-10-CM | POA: Insufficient documentation

## 2022-08-26 MED ORDER — AMOXICILLIN-POT CLAVULANATE 875-125 MG PO TABS
1.0000 | ORAL_TABLET | Freq: Once | ORAL | Status: AC
  Administered 2022-08-26: 1 via ORAL
  Filled 2022-08-26: qty 1

## 2022-08-26 MED ORDER — AMOXICILLIN-POT CLAVULANATE 875-125 MG PO TABS
1.0000 | ORAL_TABLET | Freq: Two times a day (BID) | ORAL | Status: DC
  Filled 2022-08-26: qty 14

## 2022-08-26 MED ORDER — TETANUS-DIPHTH-ACELL PERTUSSIS 5-2.5-18.5 LF-MCG/0.5 IM SUSY
0.5000 mL | PREFILLED_SYRINGE | Freq: Once | INTRAMUSCULAR | Status: AC
  Administered 2022-08-26: 0.5 mL via INTRAMUSCULAR
  Filled 2022-08-26: qty 0.5

## 2022-08-26 NOTE — ED Triage Notes (Signed)
Pt with dog bite from neighbors dog.  Shot records unclear.  Pt states "I probably just want the rabies shots anyway"  bite happened 2 days ago.  Left ankle. Has pain radiating up her calf.

## 2022-08-26 NOTE — ED Provider Notes (Signed)
[place] EMERGENCY DEPARTMENT AT [facility] Provider Note   CSN: [id number] Arrival date & time: 08/26/22  1746     History  Chief Complaint  Patient presents with   Animal Bite    Amber Griffin is a 32 y.o. female with past medical history significant for anxiety, migraines, who is unsure when her last tetanus shot was who presents with concern for dog bite from neighbors dog on her left lower extremity.   She is unclear whether or not the dog had the rabies shots.  She did contact animal control.  Patient reports the bite was 2 days ago.  She reports it is somewhat sore.  She is not having any draining of pus, or blood at this time, she has some appropriately healing scabs on the lower extremity.  Patient "probably wants rabies shot anyways" despite animal control contacted and no signs of rabies in this dog.    Animal Bite      Home Medications Prior to Admission medications   Medication Sig Start Date End Date Taking? Authorizing Provider  FLUoxetine (PROZAC) 20 MG capsule Take 20 mg by mouth daily.    [provider]  lidocaine (XYLOCAINE) 2 % jelly Apply 1 application topically as needed. 04/17/18   Leftwich-Kirby, Wilmer Floor, CNM  SUMAtriptan (IMITREX) 50 MG tablet Take one tablet at the onset of a migraine. If your headache persists, then take another tablet. Do not repeat for 24 hours. Patient not taking: Reported on 04/17/2018 09/19/17   Wallis Bamberg, PA-C  traMADol (ULTRAM) 50 MG tablet Take 1 tablet (50 mg total) by mouth every 6 (six) hours as needed. 04/17/18   Leftwich-Kirby, Wilmer Floor, CNM  cetirizine (ZYRTEC ALLERGY) 10 MG tablet Take 1 tablet (10 mg total) by mouth daily. Patient not taking: Reported on 04/17/2018 09/19/17 05/05/20  Wallis Bamberg, PA-C  fluticasone [facility]) 50 MCG/ACT nasal spray Place 2 sprays into both nostrils daily. Patient not taking: Reported on 04/17/2018 09/19/17 05/05/20  Wallis Bamberg, PA-C      Allergies    Other and  Shellfish allergy    Review of Systems   Review of Systems  All other systems reviewed and are negative.   Physical Exam Updated Vital Signs BP 121/72   Pulse 95   Temp 98.5 F (36.9 C) (Oral)   Resp 16   SpO2 99%  Physical Exam Vitals and nursing note reviewed.  Constitutional:      General: She is not in acute distress.    Appearance: Normal appearance.  HENT:     Head: Normocephalic and atraumatic.  Eyes:     General:        Right eye: No discharge.        Left eye: No discharge.  Cardiovascular:     Rate and Rhythm: Normal rate and regular rhythm.     Pulses: Normal pulses.  Pulmonary:     Effort: Pulmonary effort is normal. No respiratory distress.  Musculoskeletal:        General: No deformity.     Comments: Patient with 2 small appropriately healing abrasions with appropriate scab on the left lower extremity calf.  No significant soft tissue swelling, induration  Skin:    General: Skin is warm and dry.     Capillary Refill: Capillary refill takes less than 2 seconds.  Neurological:     Mental Status: She is alert and oriented to person, place, and time.  Psychiatric:        Mood  and Affect: Mood normal.        Behavior: Behavior normal.     ED Results / Procedures / Treatments   Labs (all labs ordered are listed, but only abnormal results are displayed) Labs Reviewed - No data to display  EKG None  Radiology No results found.  Procedures Procedures    Medications Ordered in ED Medications  amoxicillin-clavulanate (AUGMENTIN) 875-125 MG per tablet 1 tablet (has no administration in time range)  amoxicillin-clavulanate (AUGMENTIN) 875-125 MG per tablet 1 tablet (1 tablet Oral Given 08/26/22 2124)  Tdap (BOOSTRIX) injection 0.5 mL (0.5 mLs Intramuscular Given 08/26/22 2157)    ED Course/ Medical Decision Making/ A&P                              Medical Decision Making Risk Prescription drug management.   This patient is a 32 y.o. female who  presents to the ED for concern of dog bite.   Differential diagnoses prior to evaluation: Infection, need for rabies, need for Tdap, versus other  Past Medical History / Social History / Additional history: Chart reviewed. Pertinent results include: Migraines, anxiety  Physical Exam: Physical exam performed. The pertinent findings include: Neurovascularly intact left lower extremity, Patient with 2 small appropriately healing abrasions with appropriate scab on the left lower extremity calf.  No significant soft tissue swelling, induration   Medications / Treatment: Patient given Augmentin, Tdap, she reports that she cannot afford Augmentin to go home with, and I spoke with pharmacy who arranged for her to have 14 days of Augmentin to go home with.   Disposition: After consideration of the diagnostic results and the patients response to treatment, I feel that patient is stable for discharge with plan as above .   emergency department workup does not suggest an emergent condition requiring admission or immediate intervention beyond what has been performed at this time. The plan is: as above. The patient is safe for discharge and has been instructed to return immediately for worsening symptoms, change in symptoms or any other concerns.  Final Clinical Impression(s) / ED Diagnoses Final diagnoses:  Dog bite, initial encounter    Rx / DC Orders ED Discharge Orders     None         [place] 08/26/22 2222    Wynetta Fines, MD 08/27/22 [phone]

## 2022-12-24 ENCOUNTER — Emergency Department (HOSPITAL_COMMUNITY): Payer: No Typology Code available for payment source

## 2022-12-24 ENCOUNTER — Emergency Department (HOSPITAL_COMMUNITY)
Admission: EM | Admit: 2022-12-24 | Discharge: 2022-12-24 | Disposition: A | Discharge status: Home / Self Care | Payer: No Typology Code available for payment source | Attending: Emergency Medicine | Admitting: Emergency Medicine

## 2022-12-24 ENCOUNTER — Encounter (HOSPITAL_COMMUNITY): Payer: Self-pay

## 2022-12-24 ENCOUNTER — Other Ambulatory Visit: Payer: Self-pay

## 2022-12-24 DIAGNOSIS — M79601 Pain in right arm: Secondary | ICD-10-CM | POA: Insufficient documentation

## 2022-12-24 DIAGNOSIS — M79604 Pain in right leg: Secondary | ICD-10-CM | POA: Diagnosis not present

## 2022-12-24 DIAGNOSIS — R519 Headache, unspecified: Secondary | ICD-10-CM | POA: Diagnosis present

## 2022-12-24 DIAGNOSIS — Y92410 Unspecified street and highway as the place of occurrence of the external cause: Secondary | ICD-10-CM | POA: Diagnosis not present

## 2022-12-24 DIAGNOSIS — M542 Cervicalgia: Secondary | ICD-10-CM | POA: Diagnosis not present

## 2022-12-24 DIAGNOSIS — M25511 Pain in right shoulder: Secondary | ICD-10-CM | POA: Diagnosis not present

## 2022-12-24 MED ORDER — ONDANSETRON 4 MG PO TBDP: 4.0000 mg | ORAL_TABLET | Freq: Three times a day (TID) | ORAL | 0 refills | Status: AC | PRN

## 2022-12-24 MED ORDER — METHOCARBAMOL 500 MG PO TABS: 500.0000 mg | ORAL_TABLET | Freq: Two times a day (BID) | ORAL | 0 refills | Status: AC
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
## 2023-09-04 ENCOUNTER — Emergency Department (HOSPITAL_COMMUNITY)
Admission: EM | Admit: 2023-09-04 | Discharge: 2023-09-04 | Disposition: A | Discharge status: Home / Self Care | Payer: Self-pay | Attending: Emergency Medicine | Admitting: Emergency Medicine

## 2023-09-04 ENCOUNTER — Encounter (HOSPITAL_COMMUNITY): Payer: Self-pay | Admitting: Emergency Medicine

## 2023-09-04 ENCOUNTER — Emergency Department (HOSPITAL_COMMUNITY): Payer: Self-pay

## 2023-09-04 DIAGNOSIS — T189XXA Foreign body of alimentary tract, part unspecified, initial encounter: Secondary | ICD-10-CM | POA: Insufficient documentation

## 2023-09-04 DIAGNOSIS — W448XXA Other foreign body entering into or through a natural orifice, initial encounter: Secondary | ICD-10-CM | POA: Insufficient documentation

## 2023-09-04 DIAGNOSIS — K920 Hematemesis: Secondary | ICD-10-CM | POA: Insufficient documentation

## 2023-09-04 LAB — I-STAT CHEM 8, ED
BUN: 12 mg/dL (ref 6–20)
Calcium, Ion: 1.23 mmol/L (ref 1.15–1.40)
Chloride: 102 mmol/L (ref 98–111)
Creatinine, Ser: 0.8 mg/dL (ref 0.44–1.00)
Glucose, Bld: 81 mg/dL (ref 70–99)
HCT: 42 % (ref 36.0–46.0)
Hemoglobin: 14.3 g/dL (ref 12.0–15.0)
Potassium: 3.5 mmol/L (ref 3.5–5.1)
Sodium: 139 mmol/L (ref 135–145)
TCO2: 25 mmol/L (ref 22–32)

## 2023-09-04 MED ORDER — LIDOCAINE VISCOUS HCL 2 % MT SOLN
15.0000 mL | Freq: Once | OROMUCOSAL | Status: AC
  Administered 2023-09-04: 15 mL via ORAL
  Filled 2023-09-04: qty 15

## 2023-09-04 MED ORDER — ONDANSETRON 4 MG PO TBDP
4.0000 mg | ORAL_TABLET | Freq: Once | ORAL | Status: AC
  Administered 2023-09-04: 4 mg via ORAL
  Filled 2023-09-04: qty 1

## 2023-09-04 MED ORDER — ACETAMINOPHEN 325 MG PO TABS
650.0000 mg | ORAL_TABLET | Freq: Once | ORAL | Status: AC
  Administered 2023-09-04: 650 mg via ORAL
  Filled 2023-09-04: qty 2

## 2023-09-04 MED ORDER — ALUM & MAG HYDROXIDE-SIMETH 200-200-20 MG/5ML PO SUSP
30.0000 mL | Freq: Once | ORAL | Status: AC
  Administered 2023-09-04: 30 mL via ORAL
  Filled 2023-09-04: qty 30

## 2023-09-04 NOTE — Discharge Instructions (Signed)
 You were seen in the emergency room for vomiting blood (hematemesis). Your tests, including blood counts and vital signs, were normal. The most likely cause is a **Mallory-Weiss tear**, which is a small cut in the lining where the esophagus (food pipe) meets the stomach. This often happens after forceful vomiting or retching.      **What is a Mallory-Weiss tear?**      A Mallory-Weiss tear is a small injury at the junction of the esophagus and stomach. It can cause bleeding, but in most cases, the bleeding stops on its own and does not come back. Most people recover fully without needing special procedures or surgery.[1][2][3]      **What should you do at home?**      - **Rest:** Take it easy for the next day or two.      - **Diet:** You may eat a regular diet as you feel able. There is no need to avoid solid foods unless you feel nauseated or have trouble swallowing.[4]      - **Medications:** If you were prescribed a medicine to reduce stomach acid (such as a proton pump inhibitor), take it as directed. This helps the tear heal and may prevent further bleeding.[5][6]      - **Avoid:** Do not take aspirin, ibuprofen , or other anti-inflammatory medicines unless told otherwise, as these can increase the risk of bleeding.[5]      **When to seek medical help:**      Return to the emergency room or call 911 if you have any of the following:      - Vomiting blood again (especially if it is a large amount or bright red)      - Black, tarry stools      - Dizziness, fainting, or feeling very weak      - Severe stomach pain      - Trouble breathing      **Follow-up:**      - Arrange to see your primary care doctor or a gastroenterologist within the next week, or sooner if you have any concerns.      - If you take blood thinners or have other medical conditions, discuss with your doctor when to restart these medications.      **Prognosis:**      Most people with a Mallory-Weiss tear  recover fully and do not have further problems. The risk of serious complications is very low when initial tests are normal and you are feeling well.[    **Questions?**      If you have any questions or concerns, contact your healthcare provider.      Take care and watch for any warning signs listed above.

## 2023-09-04 NOTE — ED Triage Notes (Signed)
 Pt here from home with c/o swallowed  the butterfly charm off of her necklace

## 2023-09-04 NOTE — ED Provider Triage Note (Signed)
 Emergency Medicine Provider Triage Evaluation Note  Amber Griffin , a 33 y.o. female  was evaluated in triage.  Pt complains of foreign body, reports she was asleep when she noticed that she might have swallowed her butterfly necklace.  Reports this is long however is also very skinny.  She then complained of some hematemesis and nausea. Also endorsing headache. No MAT.  Review of Systems  Positive: Vomiting, abdominal pain Negative: Chest pain, sob  Physical Exam  BP 113/83 (BP Location: Right Arm)   Pulse 98   Temp 98.1 F (36.7 C)   Resp 18   SpO2 95%  Gen:   Awake, no distress   Resp:  Normal effort  MSK:   Moves extremities without difficulty  Other:    Medical Decision Making  Medically screening exam initiated at 12:59 PM.  Appropriate orders placed.  Amber Griffin was informed that the remainder of the evaluation will be completed by another provider, this initial triage assessment does not replace that evaluation, and the importance of remaining in the ED until their evaluation is complete.     Amber Christianson, PA-C 09/04/23 1303

## 2023-09-04 NOTE — ED Provider Notes (Signed)
 [place] EMERGENCY DEPARTMENT AT [facility] Provider Note   CSN: [id number] Arrival date & time: 09/04/23  1218     Patient presents with: Swallowed Foreign Body   Amber Griffin is a 33 y.o. female who presents emergency department with chief complaint of hematemesis.  Patient reports that she drank very heavily last night and does not remember a lot of what happened but had some vomiting starting around 2:00 in the morning.  She states that she vomited and noticed a little bit of blood and after that had multiple episodes of vomiting with bright red blood.  Last episode at 9:00 this morning.  She states she has a little bit of stomach upset now but has no active vomiting since that time.  She was wearing a charm bracelet and noticed that one of the charms was missing.  She states that she also found her necklace in a cup so she was concerned that maybe the charm fell off and she swallowed it when drinking out of it not realizing that it went down.  She was concerned that she could have scratched her stomach or esophagus with the charm.  She has no other complaints at this time denies night lightheadedness.  {Add pertinent medical, surgical, social history, OB history to YEP:67052}  Swallowed Foreign Body       Prior to Admission medications   Medication Sig Start Date End Date Taking? Authorizing Provider  FLUoxetine (PROZAC) 20 MG capsule Take 20 mg by mouth daily.    [provider]  lidocaine  (XYLOCAINE ) 2 % jelly Apply 1 application topically as needed. 04/17/18   Leftwich-Kirby, Olam LABOR, CNM  methocarbamol  (ROBAXIN ) 500 MG tablet Take 1 tablet (500 mg total) by mouth 2 (two) times daily. 12/24/22   Henderly, Britni A, PA-C  ondansetron  (ZOFRAN -ODT) 4 MG disintegrating tablet Take 1 tablet (4 mg total) by mouth every 8 (eight) hours as needed. 12/24/22   Henderly, Britni A, PA-C  SUMAtriptan  (IMITREX ) 50 MG tablet Take one tablet at the onset of a  migraine. If your headache persists, then take another tablet. Do not repeat for 24 hours. Patient not taking: Reported on 04/17/2018 09/19/17   Christopher Savannah, PA-C  traMADol  (ULTRAM ) 50 MG tablet Take 1 tablet (50 mg total) by mouth every 6 (six) hours as needed. 04/17/18   Leftwich-Kirby, Olam LABOR, CNM  cetirizine  (ZYRTEC  ALLERGY) 10 MG tablet Take 1 tablet (10 mg total) by mouth daily. Patient not taking: Reported on 04/17/2018 09/19/17 05/05/20  Christopher Savannah, PA-C  fluticasone  (FLONASE ) 50 MCG/ACT nasal spray Place 2 sprays into both nostrils daily. Patient not taking: Reported on 04/17/2018 09/19/17 05/05/20  Christopher Savannah, PA-C    Allergies: Other and Shellfish allergy    Review of Systems  Updated Vital Signs BP 113/83 (BP Location: Right Arm)   Pulse 98   Temp 98.1 F (36.7 C)   Resp 18   LMP 08/22/2023 (Exact Date)   SpO2 95%   Physical Exam Vitals and nursing note reviewed.  Constitutional:      General: She is not in acute distress.    Appearance: She is well-developed. She is not diaphoretic.  HENT:     Head: Normocephalic and atraumatic.     Right Ear: External ear normal.     Left Ear: External ear normal.     Nose: Nose normal.     Mouth/Throat:     Mouth: Mucous membranes are moist.  Eyes:     General: No  scleral icterus.    Conjunctiva/sclera: Conjunctivae normal.  Cardiovascular:     Rate and Rhythm: Normal rate and regular rhythm.     Heart sounds: Normal heart sounds. No murmur heard.    No friction rub. No gallop.  Pulmonary:     Effort: Pulmonary effort is normal. No respiratory distress.     Breath sounds: Normal breath sounds.  Abdominal:     General: Bowel sounds are normal. There is no distension.     Palpations: Abdomen is soft. There is no mass.     Tenderness: There is no abdominal tenderness. There is no guarding.  Musculoskeletal:     Cervical back: Normal range of motion.  Skin:    General: Skin is warm and dry.  Neurological:     Mental Status:  She is alert and oriented to person, place, and time.  Psychiatric:        Behavior: Behavior normal.     (all labs ordered are listed, but only abnormal results are displayed) Labs Reviewed  I-STAT CHEM 8, ED    EKG: None  Radiology: DG Chest 2 View Result Date: 09/04/2023 CLINICAL DATA:  Swallowed foreign body EXAM: CHEST - 2 VIEW COMPARISON:  Same day abdominal radiographs FINDINGS: No radiodense foreign bodies identified. The heart size and mediastinal contours are within normal limits. Both lungs are clear. The visualized skeletal structures are unremarkable. IMPRESSION: No acute findings.  No radiodense foreign bodies. Electronically Signed   By: Michaeline Blanch M.D.   On: 09/04/2023 14:05   DG Abdomen 1 View Result Date: 09/04/2023 CLINICAL DATA:  foreign body, vomiting, reportedly swallowed the butterfly charm off of her necklace. EXAM: ABDOMEN - 1 VIEW COMPARISON:  May 05, 2020 FINDINGS: Nonobstructive bowel gas pattern. No pneumoperitoneum. No organomegaly or radiopaque calculi. No acute fracture or destructive lesion. The lung bases are clear.No radiopaque foreign body. IMPRESSION: Nonobstructive bowel gas pattern.  No radiopaque foreign body. Electronically Signed   By: Rogelia Myers M.D.   On: 09/04/2023 13:44    {Document cardiac monitor, telemetry assessment procedure when appropriate:32947} Procedures   Medications Ordered in the ED  alum & mag hydroxide-simeth (MAALOX/MYLANTA) 200-200-20 MG/5ML suspension 30 mL (has no administration in time range)    And  lidocaine  (XYLOCAINE ) 2 % viscous mouth solution 15 mL (has no administration in time range)  ondansetron  (ZOFRAN -ODT) disintegrating tablet 4 mg (has no administration in time range)  acetaminophen  (TYLENOL ) tablet 650 mg (650 mg Oral Given 09/04/23 1435)      {Click here for ABCD2, HEART and other calculators REFRESH Note before signing:1}                              Medical Decision Making Amount and/or  Complexity of Data Reviewed Radiology: ordered.  Risk OTC drugs. Prescription drug management.   Patient here with hematemesis and concern for potential swallowing of a foreign body.  Original diagnosis includes Mallory-Weiss tear, gastritis, gastric ulcer, less likely esophageal rupture. Low suspicion that the patient swallowed a foreign body and plain films obtained from triage show no evidence of such.  Patient also notably has no pneumomediastinum suggestive of esophageal rupture.  She is very well-appearing with normal vital signs.  I personally visualized and interpreted plain films including a two-view chest and KUB all of which show no acute findings. At this time I will obtain a Chem-8 to make sure that the patient's hemoglobin is not significantly low.  Also ordered medications including GI cocktail and Zofran .   5:50 PM Patient's labs have returned there are no acute findings.  Normal hemoglobin, she is hemodynamically stable without hypotension or tachycardia suggestive of significant hemorrhage.  Patient's symptoms improved after medications given.  Suspect she likely had a small Mallory-Weiss tear from vomiting after drinking alcohol.  Patient appears clinically stable for discharge at this time.  Discussed outpatient follow-up and return precautions.  Appropriate for discharge {Document critical care time when appropriate  Document review of labs and clinical decision tools ie CHADS2VASC2, etc  Document your independent review of radiology images and any outside records  Document your discussion with family members, caretakers and with consultants  Document social determinants of health affecting pt's care  Document your decision making why or why not admission, treatments were needed:32947:::1}   Final diagnoses:  None    ED Discharge Orders     None
